# Patient Record
Sex: Female | Born: 1950 | Race: Black or African American | Hispanic: No | Marital: Married
Health system: Midwestern US, Community
[De-identification: ages and names within clinical notes are randomized; demographics above are authoritative.]

## PROBLEM LIST (undated history)

## (undated) DIAGNOSIS — I10 Essential (primary) hypertension: Secondary | ICD-10-CM

## (undated) DIAGNOSIS — Z8719 Personal history of other diseases of the digestive system: Secondary | ICD-10-CM

## (undated) DIAGNOSIS — E119 Type 2 diabetes mellitus without complications: Secondary | ICD-10-CM

## (undated) DIAGNOSIS — T7840XA Allergy, unspecified, initial encounter: Secondary | ICD-10-CM

## (undated) HISTORY — DX: Allergy, unspecified, initial encounter: T78.40XA

## (undated) HISTORY — DX: Essential (primary) hypertension: I10

## (undated) HISTORY — DX: Personal history of other diseases of the digestive system: Z87.19

## (undated) HISTORY — PX: BREAST BIOPSY: SHX20

---

## 1994-11-27 HISTORY — PX: ABDOMINAL HYSTERECTOMY: SHX81

## 2011-08-18 ENCOUNTER — Ambulatory Visit: Payer: Self-pay

## 2012-03-29 ENCOUNTER — Ambulatory Visit: Payer: Self-pay | Admitting: Urology

## 2012-07-21 ENCOUNTER — Emergency Department: Payer: Self-pay | Admitting: Emergency Medicine

## 2012-07-21 LAB — URINALYSIS, COMPLETE
Blood: NEGATIVE
Glucose,UR: NEGATIVE mg/dL (ref 0–75)
Nitrite: NEGATIVE
Ph: 5 (ref 4.5–8.0)
Protein: 30
Specific Gravity: 1.031 (ref 1.003–1.030)

## 2012-07-23 LAB — URINE CULTURE

## 2012-11-27 HISTORY — PX: HAND ARTHROPLASTY: SHX968

## 2013-04-16 ENCOUNTER — Encounter: Payer: Self-pay | Admitting: Family Medicine

## 2013-04-27 ENCOUNTER — Encounter: Payer: Self-pay | Admitting: Family Medicine

## 2013-05-26 ENCOUNTER — Emergency Department: Payer: Self-pay | Admitting: Emergency Medicine

## 2013-05-26 LAB — COMPREHENSIVE METABOLIC PANEL
Alkaline Phosphatase: 60 U/L (ref 50–136)
Anion Gap: 5 — ABNORMAL LOW (ref 7–16)
BUN: 13 mg/dL (ref 7–18)
Calcium, Total: 9.2 mg/dL (ref 8.5–10.1)
Co2: 31 mmol/L (ref 21–32)
Osmolality: 283 (ref 275–301)
Potassium: 3.2 mmol/L — ABNORMAL LOW (ref 3.5–5.1)
SGOT(AST): 44 U/L — ABNORMAL HIGH (ref 15–37)
Sodium: 141 mmol/L (ref 136–145)

## 2013-05-26 LAB — URINALYSIS, COMPLETE
Bilirubin,UR: NEGATIVE
Glucose,UR: NEGATIVE mg/dL (ref 0–75)
Ketone: NEGATIVE
Leukocyte Esterase: NEGATIVE
Protein: NEGATIVE
RBC,UR: 1 /HPF (ref 0–5)

## 2013-05-26 LAB — CBC
MCH: 29.4 pg (ref 26.0–34.0)
Platelet: 226 10*3/uL (ref 150–440)
WBC: 7.7 10*3/uL (ref 3.6–11.0)

## 2013-05-27 ENCOUNTER — Encounter: Payer: Self-pay | Admitting: Family Medicine

## 2013-08-05 ENCOUNTER — Emergency Department: Payer: Self-pay | Admitting: Emergency Medicine

## 2013-08-05 LAB — CK TOTAL AND CKMB (NOT AT ARMC)
CK, Total: 132 U/L (ref 21–215)
CK-MB: 1 ng/mL (ref 0.5–3.6)

## 2013-08-05 LAB — BASIC METABOLIC PANEL
Anion Gap: 3 — ABNORMAL LOW (ref 7–16)
Chloride: 104 mmol/L (ref 98–107)
Creatinine: 0.75 mg/dL (ref 0.60–1.30)
EGFR (African American): >60
EGFR (Non-African Amer.): >60
Potassium: 3.2 mmol/L — ABNORMAL LOW (ref 3.5–5.1)

## 2013-08-05 LAB — CBC
HCT: 36.6 % (ref 35.0–47.0)
HGB: 12.3 g/dL (ref 12.0–16.0)
MCHC: 33.6 g/dL (ref 32.0–36.0)
RBC: 4.15 10*6/uL (ref 3.80–5.20)
RDW: 15.5 % — ABNORMAL HIGH (ref 11.5–14.5)
WBC: 8.8 10*3/uL (ref 3.6–11.0)

## 2013-08-05 LAB — TROPONIN I: Troponin-I: <0.02 ng/mL

## 2013-09-10 ENCOUNTER — Ambulatory Visit: Payer: Self-pay | Admitting: Specialist

## 2014-05-11 ENCOUNTER — Ambulatory Visit: Payer: Self-pay | Admitting: Unknown Physician Specialty

## 2014-05-13 LAB — PATHOLOGY REPORT

## 2016-03-13 ENCOUNTER — Ambulatory Visit: Payer: PRIVATE HEALTH INSURANCE | Attending: Physician Assistant | Admitting: Physical Therapy

## 2016-03-13 DIAGNOSIS — M6281 Muscle weakness (generalized): Secondary | ICD-10-CM | POA: Insufficient documentation

## 2016-03-13 DIAGNOSIS — M5441 Lumbago with sciatica, right side: Secondary | ICD-10-CM | POA: Insufficient documentation

## 2016-03-13 DIAGNOSIS — R252 Cramp and spasm: Secondary | ICD-10-CM | POA: Insufficient documentation

## 2016-03-13 NOTE — Therapy (Signed)
[facility]ICINE [address] [place], [place], [address] Phone: [phone]   Fax:  [phone]  Physical Therapy Evaluation  Patient Details  Name: Kim RiggsSylvia Lowery MRN: [id number] Date of Birth: 30-May-1951 Referring Provider: Dayton Bailiffobert John Tumey, PA  Encounter Date: 03/13/2016      PT End of Session - 03/13/16 1939    Visit Number 1   Number of Visits 12   Date for PT Re-Evaluation 04/24/16   PT Start Time 1935   PT Stop Time 2035   PT Time Calculation (min) 60 min   Activity Tolerance Patient tolerated treatment well   Behavior During Therapy [facility] for tasks assessed/performed      No past medical history on file.  No past surgical history on file.  There were no vitals filed for this visit.       Subjective Assessment - 03/13/16 1940    Subjective Patient reports increased glute pain with radiating symptoms running along the lateral aspect of the leg into the top of the foot. Increased low back pain with bending, rising from sit, raising self up on right LE to sit in a high chair, lifting and performing daily tasks. Valium decreases pain. Alleviation with medication and rest. Sleeps on side (rightside).   Pertinent History Experienced same pain 6 years prior, [place] pain   Limitations Sitting;Lifting;Standing;Walking   Patient Stated Goals Decrease pain in the glute, sacroiliac joint and back.    Currently in Pain? Yes   Pain Score 6    Pain Location Buttocks   Pain Orientation Right   Pain Descriptors / Indicators Aching;Tender;Numbness;Tingling   Pain Type Acute pain;Chronic pain   Pain Radiating Towards Radiating along lateral aspect of the leg into the dorsum of the foot   Pain Onset More than a month ago   Pain Frequency Constant   Aggravating Factors  Lifting, bending, prolonged lumbar flexion, ariising from sitting   Pain Relieving Factors medication, rest             [facility] - 03/13/16 1951    Assessment   Medical Diagnosis Acute right-sided low back pain with right-sided sciatica (M54.41)   Referring Provider Dayton Bailiffobert John Tumey, PA   Onset Date/Surgical Date 12/29/15   Hand Dominance Right   Next MD Visit Unknown   Prior Therapy None   Precautions   Precautions None   Precaution Comments None   Balance Screen   Has the patient fallen in the past 6 months No   Has the patient had a decrease in activity level because of a fear of falling?  No   Is the patient reluctant to leave their home because of a fear of falling?  No   Home Environment   Living Environment Private residence   Living Arrangements Spouse/significant other   Type of Home House   Home Access Stairs to enter   Entrance Stairs-Number of Steps 3   Entrance Stairs-Rails Can reach both   Home Layout One level   Prior Function   Level of Independence Independent   Vocation Full time employment   Engineer, siteVocation Requirements Nurse Practioner: Stepping to sit on a high chair, walking,  lifting, and bending    Leisure Gardening, estate Airline pilotsales,    IT consultantCognition   Overall Cognitive Status Within Functional Limits for tasks assessed       Objective: Observation: Ambulation: Increased stance time on L Palpation: Increased tenderness,  pain and spasms along the right SIJ, glute max, lower lumbar spine paraspinal muscles and superior attachment to hamstring.   Measurement: Lumbar AROM/MMT: Flexion: WNL -- pain when arising from standing, Extension: 25% limited--pain at end range, Left lateral flexion: WNL, Right lateral flexion: WNL-- intense pain at end range, Right rotation: WNL, Left rotation: WNL R Hip AROM/MMT: Flexion:WNL/5/5, Extension: Not tested due to increased pain, ABD: WNL/ 4-/5, ADD:Not tested due to increased pain, ER:WNL/4-/5 L Hip AROM/MMT: Flexion:WNL/5/5, Extension:Not tested due to increased pain, ABD:WNL/5/5, ADD:Not tested due to increased pain,  ER:WNL/5/5,  R Knee AROM/MMT: Flexion:WNL/ 3+/5, Extension:WNL/5/5 L Knee AROM/MMT: Flexion: WNL/ 4-/5, Extension: WNL/5/5  Neurological Exam: Sensation (light touch): L1:WNL, L2:WNL, L3:WNL, L4:WNL, L5,S1,S2:hyper sensitive on R,; MMT: L1:WNL, L2:WNL, L3:WNL, L4:WNL, L5:WNL, S1:3+/5 on Right; 4-/5 on Left,  Reflexes: L4:Decreased bilaterally, S1:Decreased Bilaterally Special Testing: Hip: (+): SKOUR on R--positive for unrelated hip pain; FADIR on R: Increased glute pain. Hip (-): FABER bilaterally; SKOUR and FADIR on Left Lumbar (-): SLUMP--bilaterally; (-) SLR--bilaterally;  (-) cross legged SLR --bilaterally  Outcome Measures:  Modified Oswestry: 54% (Moderate-High self perceived disability: in need of intervention); FABQ-PA: 23/30 (in need of intervention) ; FABQ-W: 21/66  Treatment:  Therapeutic Exercise: Patient performed exercises with guidance, verbal and tactile cues and demonstration of therapist: Sidelying hip abduction-- x5 bilaterally Prone and prone on elbows -- ~ 5 min  Manual Therapy:  Soft tissue mobilization utilizing superficial techniques to the right glute max, lumbar extensors bilaterally, and superior attachment to hamstring to decrease spasms and guarding with the patient positioned in prone on elbows.   Modalities: Ice pack placed along lumbar spine (all levels) with patient in prone to decrease inflammation in the low back and spasms in the lumbar extensors x 8 min (no adverse reactions noted from ice)  Patient response to treatment: Resolution of pain after lying prone and place ice over lumbar spine for 8 min indicating decreased inflammation and extension bias. Increased R glute and radiating symptoms when performing general hip and back movements.        PT Education - 03/13/16 1939    Education provided Yes   Education Details HEP including posture/positioning for sleep, sitting with handout for daily activties, instructed to use ice and lie prone or  side lying to relieve symptoms   Person(s) Educated Patient   Methods Explanation;Handout;Demonstration   Comprehension Verbalized understanding;Returned demonstration             PT Long Term Goals - 03/13/16 1934    PT LONG TERM GOAL #1   Title Pt will demonstrate a score of 20% or less of the Modified Oswestry Scale outcome measure by 04/24/16 indicating significant improvement in lumbar function and ablility to better perform lifting activities.   Baseline Modified Oswestry Score: 54%   Status New   PT LONG TERM GOAL #2   Title Pt will be independent with HEP in terms of exercise progression and performance by 04/24/16 to self manage and maintain functional gains from therapy.    Baseline Dependent with exercise performance and progression   Status New               Plan - 03/13/16 1940    Clinical Impression Statement Patient is a 65 yo female presenting with right sided low back and buttock pain with N/T radiating down the lateral  aspect of the LE into the top of the foot. Patient's Modified Oswestry score indicates significant self perceived disability (54%) with daily tasks  and  presents with increased pain with all lumbar movements. Sacroiliac joint provacation tests were negative for sacroiliac joint dysfunction. Pain decreased after lying prone with ice (placed over the lumbar spine)  which indicates directional preference and increased sciatic nerve irritation secondary to inflammation. Pt will benefit from further skilled physical therapy intervention to improve lumbar function and decrease pain to return  to prior level of function.    Rehab Potential Good   Clinical Impairments Affecting Rehab Potential (+) Highly motivated, family support; (-) age, chronicity of condition   PT Frequency 2x / week   PT Duration 6 weeks   PT Treatment/Interventions Cryotherapy;Electrical Stimulation;Iontophoresis /ml Dexamethasone;Moist Heat;Therapeutic exercise;Therapeutic  activities;Stair training;Ultrasound;Neuromuscular re-education;Patient/family education;Manual techniques;Passive range of motion   PT Next Visit Plan Pelvic tilits, prone hip ext,    PT Home Exercise Plan Positioning and ergonomics    Consulted and Agree with Plan of Care Patient      Patient will benefit from skilled therapeutic intervention in order to improve the following deficits and impairments:  Decreased coordination, Increased fascial restricitons, Decreased endurance, Increased muscle spasms, Decreased activity tolerance, Pain, Decreased mobility, Decreased strength, Increased edema, Impaired sensation, Impaired flexibility, Decreased range of motion  Visit Diagnosis: Muscle weakness (generalized) - Plan: PT plan of care cert/re-cert  Cramp and spasm - Plan: PT plan of care cert/re-cert  Lumbago with sciatica, right side - Plan: PT plan of care cert/re-cert     Problem List There are no active problems to display for this patient.   Myrene Galas, SPT 03/14/2016, 5:38 PM  [facility]ICAL AND SPORTS MEDICINE [address] [place], [address] Phone: [phone]   Fax:  [phone]  Name: Kim Lowery MRN: [id number] Date of Birth: Nov 19, 1951

## 2016-03-21 ENCOUNTER — Ambulatory Visit: Payer: PRIVATE HEALTH INSURANCE | Admitting: Physical Therapy

## 2016-03-21 DIAGNOSIS — R252 Cramp and spasm: Secondary | ICD-10-CM

## 2016-03-21 DIAGNOSIS — M6281 Muscle weakness (generalized): Secondary | ICD-10-CM | POA: Diagnosis not present

## 2016-03-21 DIAGNOSIS — M5441 Lumbago with sciatica, right side: Secondary | ICD-10-CM

## 2016-03-21 NOTE — Therapy (Signed)
[facility] [address] [place], [place], [address] Phone: [phone]   Fax:  [phone]  Physical Therapy Treatment  Patient Details  Name: Kim RiggsSylvia Lowery MRN: [id number] Date of Birth: June 08, 1951 Referring Provider: Dayton Bailiffobert John Tumey, PA  Encounter Date: 03/21/2016      PT End of Session - 03/21/16 2014    Visit Number 2   Number of Visits 12   Date for PT Re-Evaluation 04/24/16   PT Start Time 1903   PT Stop Time 1945   PT Time Calculation (min) 42 min   Activity Tolerance Patient tolerated treatment well   Behavior During Therapy [facility] for tasks assessed/performed      No past medical history on file.  No past surgical history on file.  There were no vitals filed for this visit.      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 Continues with reports of right LE pain that has increased since previous session and she is unable to control pain even with medication.   Diagnostic tests MRI lumbar spine   Patient Stated Goals Decrease pain in the glute, sacroiliac joint and back.    Currently in Pain? Yes   Pain Score 6    Pain Location Buttocks  radiating into right LE   Pain Orientation Right   Pain Descriptors / Indicators Aching   Pain Type Acute pain   Pain Onset More than a month ago      Objective: Palpation: + spasms/pain/hyper sensitive to palpation right and left L5-S1 region  Gait: slow cadence Posture: in alignment, no obvious lateral shift right or left  Treatment:  patient performed exercises with guidance, verbal and tactile cues and demonstration of PT: Exercise:  Prone progression: prone lying x 10 min., attempted on elbows with reported increased symptoms therefore discontinued Flexion rotation with patient left side lying 3 x 30 seconds with good results with  decreased pain into right LE which stay reduced following session  Modalities: Ultrasound performed by PT to lower lumbar spine paraspinal region bilaterally: 1MHz pulsed 50% x 10 min 1.4w/cm2 with patient prone lying, head and LE's supported  Patient response to treatment:  Decreased pain in right LE to 0/10 and able to stand and walk at end of session without return of symptoms, patient verbalized good understanding of posture for sitting, avoid flexion activities, encourage extension as tolerated to decreased right LE symptoms         PT Education - 03/21/16 2014    Education provided Yes   Education Details HEP: flexion rotation, prone progression, standing extension, posture awareness for sitting, daily activties   Person(s) Educated Patient   Methods Explanation   Comprehension Verbalized understanding             PT Long Term Goals - 03/13/16 1934    PT LONG TERM GOAL #1   Title Pt will demonstrate a score of 20% or less of the Modified Oswestry Scale outcome measure by 04/24/16 indicating significant improvement in lumbar function and ablility to better perform lifting activities.   Baseline Modified Oswestry Score: 54% limited   Status New   PT LONG TERM GOAL #2   Title Pt will be independent with HEP in terms of exercise progression and performance by 04/24/16 to self manage and maintain functional gains from therapy.    Baseline  Dependent with exercise performance and progression   Status New               Plan - 03/21/16 2015    Clinical Impression Statement     Rehab Potential Patient responded well to therapy intervention with decreased pain with lumbar flexion rotation and ice to lower back and [place]. She should continue to progress with PT intervention including iontophoresis next session to further decreased inflammation and pain.  Good   PT Frequency 2x / week   PT Duration PT Treatment/Interventions 6 weeks Cryotherapy;Electrical  Stimulation;Iontophoresis /ml Dexamethasone;Moist Heat;Therapeutic exercise;Therapeutic activities;Stair training;Ultrasound;Neuromuscular re-education;Patient/family education;Manual techniques;Passive range of motion   PT Next Visit Plan prone progression, modalities to decrease pain   PT Home Exercise Plan Positioning and ergonomics       Patient will benefit from skilled therapeutic intervention in order to improve the following deficits and impairments:  Decreased coordination, Increased fascial restricitons, Decreased endurance, Increased muscle spasms, Decreased activity tolerance, Pain, Decreased mobility, Decreased strength, Increased edema, Impaired sensation, Impaired flexibility, Decreased range of motion  Visit Diagnosis: Muscle weakness (generalized)  Cramp and spasm  Lumbago with sciatica, right side     Problem List There are no active problems to display for this patient.   Carl Best 03/21/2016, 8:18 PM  [facility] [address] [place], [address] Phone: [phone]   Fax:  [phone]  Name: Kim Lowery MRN: [id number] Date of Birth: January 04, 1951

## 2016-03-23 ENCOUNTER — Encounter: Payer: Self-pay | Admitting: Physical Therapy

## 2016-03-23 ENCOUNTER — Ambulatory Visit: Payer: PRIVATE HEALTH INSURANCE | Admitting: Physical Therapy

## 2016-03-23 DIAGNOSIS — M6281 Muscle weakness (generalized): Secondary | ICD-10-CM

## 2016-03-23 DIAGNOSIS — M5441 Lumbago with sciatica, right side: Secondary | ICD-10-CM

## 2016-03-23 DIAGNOSIS — R252 Cramp and spasm: Secondary | ICD-10-CM

## 2016-03-23 NOTE — Therapy (Signed)
[facility] [address], [place], [address] Phone: [phone]   Fax:  [phone]  Physical Therapy Treatment  Patient Details  Name: Kim Lowery MRN: [id number] Date of Birth: December 31, 1950 Referring Provider: Dayton Bailiff, PA  Encounter Date: 03/23/2016      PT End of Session - 03/23/16 1932    Visit Number 3   Number of Visits 12   Date for PT Re-Evaluation 04/24/16   PT Start Time 1853   PT Stop Time 1938   PT Time Calculation (min) 45 min   Activity Tolerance Patient tolerated treatment well   Behavior During Therapy [facility] for tasks assessed/performed      History reviewed. No pertinent past medical history.  History reviewed. No pertinent past surgical history.  There were no vitals filed for this visit.      Subjective Assessment - 03/23/16 1857    Subjective Patient reports she had a steroid injection in right buttock yesterday and reports she is having much less pain today. pain was a 12/10 and now is a 4 /10. She reports that the modalities help with her pain and she is performing exercises as instructed with good results.    Limitations Sitting;Lifting;Standing;Walking   Diagnostic tests MRI lumbar spine   Patient Stated Goals Decrease pain in the glute, sacroiliac joint and back.    Currently in Pain? Yes   Pain Score 6    Pain Location Buttocks   Pain Orientation Right   Pain Descriptors / Indicators Aching   Pain Type Acute pain   Pain Onset More than a month ago   Pain Frequency Constant        Objective: Palpation: + spasms/pain/hyper sensitive to palpation right and left L5-S1 region  Posture: in alignment, no obvious lateral shift right or left  Treatment:  patient performed exercises with guidance, verbal and tactile cues and demonstration of PT: Exercise:  Prone progression: prone lying x 10 min., on elbows x 2 mn.  with reported symptoms increasing into right  lateral thigh therefore discontinued Flexion rotation with patient left and right side lying 2 x 30 seconds with good results with decreased pain into right LE which stay reduced following session  Modalities: Ultrasound performed by PT to lower lumbar spine paraspinal region bilaterally: pulsed 50% x 10 min 1.4w/cm2 with patient prone lying, head and LE's supported High volt estim: clinical program for muscle spasms: (4) electrodes applied to lumbar spine paraspinal muscles with ice pack with patient prone lying with pillow supporting lower legs x 15 min.  Patient response to treatment:  Decreased pain in right LE to 0/10 and able to stand and walk at end of session without return of symptoms, patient verbalized good understanding of posture for sitting, avoid flexion activities, encourage extension as tolerated to decreased right LE symptoms, no adverse reactions noted to ice/stim or [place]          PT Long Term Goals - 03/13/16 1934    PT LONG TERM GOAL #1   Title Pt will demonstrate a score of 20% or less of the Modified Oswestry Scale outcome measure by 04/24/16 indicating significant improvement in lumbar function and ablility to better perform lifting activities.   Baseline Modified Oswestry Score: 54% limited   Status New   PT LONG TERM GOAL #2   Title Pt will be independent with HEP in terms of exercise progression and performance by 04/24/16 to self manage and maintain  functional gains from therapy.    Baseline Dependent with exercise performance and progression   Status New               Plan - 03/23/16 1932    Clinical Impression Statement Patient is responding well to modalities to decrease pain and spasms. Improved abiltiy to walk and sit with less pain at end of the day. Good understanding of posture awareness and exercises to reduce symptoms.    Rehab Potential Good   PT Frequency 2x / week   PT Duration 6 weeks   PT Treatment/Interventions  Cryotherapy;Electrical Stimulation;Iontophoresis 4mg /ml Dexamethasone;Moist Heat;Therapeutic exercise;Therapeutic activities;Stair training;Ultrasound;Neuromuscular re-education;Patient/family education;Manual techniques;Passive range of motion   PT Next Visit Plan prone progression; posture awareness for sitting/standing and daily chores   PT Home Exercise Plan Positioning and ergonomics       Patient will benefit from skilled therapeutic intervention in order to improve the following deficits and impairments:  Decreased coordination, Increased fascial restricitons, Decreased endurance, Increased muscle spasms, Decreased activity tolerance, Pain, Decreased mobility, Decreased strength, Increased edema, Impaired sensation, Impaired flexibility, Decreased range of motion  Visit Diagnosis: Muscle weakness (generalized)  Cramp and spasm  Lumbago with sciatica, right side     Problem List There are no active problems to display for this patient.   Beacher MayBrooks, Marie PT 03/23/2016, 10:30 PM   [facility] [address] [place], [place], [address] Phone: [phone]   Fax:  [phone]  Name: Kim RiggsSylvia Lowery MRN: [id number] Date of Birth: July 08, 1951

## 2016-03-28 ENCOUNTER — Encounter: Payer: Self-pay | Admitting: Physical Therapy

## 2016-03-28 ENCOUNTER — Encounter: Payer: PRIVATE HEALTH INSURANCE | Admitting: Physical Therapy

## 2016-03-28 ENCOUNTER — Ambulatory Visit: Payer: PRIVATE HEALTH INSURANCE | Attending: Physician Assistant | Admitting: Physical Therapy

## 2016-03-28 DIAGNOSIS — R252 Cramp and spasm: Secondary | ICD-10-CM | POA: Diagnosis present

## 2016-03-28 DIAGNOSIS — M6281 Muscle weakness (generalized): Secondary | ICD-10-CM | POA: Diagnosis present

## 2016-03-28 DIAGNOSIS — M5441 Lumbago with sciatica, right side: Secondary | ICD-10-CM

## 2016-03-29 NOTE — Therapy (Signed)
[place] [facility] [address], [place], [address] Phone: [phone]   Fax:  [phone]  Physical Therapy Treatment  Patient Details  Name: Kim Lowery MRN: [id number] Date of Birth: 25-Oct-1951 Referring Provider: Dayton Bailiff, PA  Encounter Date: 03/28/2016      PT End of Session - 03/28/16 1100    Visit Number 4   Number of Visits 12   Date for PT Re-Evaluation 04/24/16   PT Start Time 1002   PT Stop Time 1040   PT Time Calculation (min) 38 min   Activity Tolerance Patient tolerated treatment well   Behavior During Therapy [facility] for tasks assessed/performed      History reviewed. No pertinent past medical history.  History reviewed. No pertinent past surgical history.  There were no vitals filed for this visit.      Subjective Assessment - 03/28/16 1007    Subjective Patient reports she is having much less pain today. pain was moderate and is now mild. She reports that the modalities help with her pain and she is performing exerises as instructed with good results.    Limitations Sitting;Lifting;Standing;Walking   Diagnostic tests MRI lumbar spine   Patient Stated Goals Decrease pain in the glute, sacroiliac joint and back.    Currently in Pain? Yes   Pain Score 2    Pain Location Leg  right outer thigh   Pain Orientation Right   Pain Descriptors / Indicators Aching   Pain Type Acute pain   Pain Onset More than a month ago      Objective: Palpation: + spasms/pain/hyper sensitive to palpation right and left L5-S1 region  Posture: in alignment, no obvious lateral shift right or left  Treatment:  patient performed exercises with guidance, verbal and tactile cues and demonstration of PT: Exercise:  Prone progression: prone lying x 10 min., on elbows x 2 mn. with reported symptoms increasing into right lateral thigh therefore discontinued Prone upper back extension x 3, hip extension  left and right x 3 reps: both with increased symptoms in back and right lateral thigh  Modalities: Ultrasound performed by PT to lower lumbar spine paraspinal region bilaterally: pulsed 50% x 10 min 1.4w/cm2 with patient prone lying, head and LE's supported High volt estim: clinical program for muscle spasms: (4) electrodes applied to lumbar spine paraspinal muscles with ice pack with patient prone lying with pillow supporting lower legs x 15 min.  Patient response to treatment:  Decreased pain in right LE to 0/10 and able to stand and walk at end of session without return of symptoms, patient verbalized good understanding of posture for sitting, avoid flexion activities, encourage extension as tolerated to decrease right LE symptoms, no adverse reactions noted to ice/stim or [place]        PT Education - 03/28/16 1000    Education provided Yes   Education Details HEP: continue with pain control and posiitoning, posture awareness   Person(s) Educated Patient   Methods Explanation   Comprehension Verbalized understanding             PT Long Term Goals - 03/13/16 1934    PT LONG TERM GOAL #1   Title Pt will demonstrate a score of 20% or less of the Modified Oswestry Scale outcome measure by 04/24/16 indicating significant improvement in lumbar function and ablility to better perform lifting activities.   Baseline Modified Oswestry Score: 54% limited   Status New   PT  LONG TERM GOAL #2   Title Pt will be independent with HEP in terms of exercise progression and performance by 04/24/16 to self manage and maintain functional gains from therapy.    Baseline Dependent with exercise performance and progression   Status New               Plan - 03/28/16 1100    Clinical Impression Statement Patient demonstrates decreasing pain in right lower back/right LE with treatment. She continues with increased symptoms with aggravating activities such as active extension of LE and upper  back. She was unable to progress to more active exercises due to increased symptoms in right LE. She wil continue to require physical therapy intervention to control swelling/inflammation and pain in order to regain full pain free ROM and return to prior level of funciton.    Rehab Potential Good   PT Frequency 2x / week   PT Duration 6 weeks   PT Treatment/Interventions Cryotherapy;Electrical Stimulation;Iontophoresis 4mg /ml Dexamethasone;Moist Heat;Therapeutic exercise;Therapeutic activities;Stair training;Ultrasound;Neuromuscular re-education;Patient/family education;Manual techniques;Passive range of motion   PT Next Visit Plan prone progression; pain control   PT Home Exercise Plan Positioning and ergonomics       Patient will benefit from skilled therapeutic intervention in order to improve the following deficits and impairments:  Decreased coordination, Increased fascial restricitons, Decreased endurance, Increased muscle spasms, Decreased activity tolerance, Pain, Decreased mobility, Decreased strength, Increased edema, Impaired sensation, Impaired flexibility, Decreased range of motion  Visit Diagnosis: Muscle weakness (generalized)  Cramp and spasm  Lumbago with sciatica, right side     Problem List There are no active problems to display for this patient.   Beacher MayBrooks, Marie PT 03/29/2016, 8:41 AM  [facility] [address] [place], [place], [address] Phone: [phone]   Fax:  [phone]  Name: Kim RiggsSylvia Lowery MRN: [id number] Date of Birth: 1951/08/20

## 2016-03-30 ENCOUNTER — Encounter: Payer: Self-pay | Admitting: Physical Therapy

## 2016-03-30 ENCOUNTER — Ambulatory Visit: Payer: PRIVATE HEALTH INSURANCE | Admitting: Physical Therapy

## 2016-03-30 DIAGNOSIS — M5441 Lumbago with sciatica, right side: Secondary | ICD-10-CM

## 2016-03-30 DIAGNOSIS — M6281 Muscle weakness (generalized): Secondary | ICD-10-CM

## 2016-03-30 DIAGNOSIS — R252 Cramp and spasm: Secondary | ICD-10-CM

## 2016-03-30 NOTE — Therapy (Signed)
[place] [facility] [address], [place], [address] Phone: [phone]   Fax:  [phone]  Physical Therapy Treatment  Patient Details  Name: Kim Lowery MRN: [id number] Date of Birth: 03/15/1951 Referring Provider: Dayton Bailiff, PA  Encounter Date: 03/30/2016      PT End of Session - 03/30/16 2000    Visit Number 5   Number of Visits 12   Date for PT Re-Evaluation 04/24/16   PT Start Time 1907   PT Stop Time 1955   PT Time Calculation (min) 48 min   Activity Tolerance Patient tolerated treatment well   Behavior During Therapy [facility] for tasks assessed/performed      History reviewed. No pertinent past medical history.  History reviewed. No pertinent past surgical history.  There were no vitals filed for this visit.      Subjective Assessment - 03/30/16 1937    Subjective Patient reports much improved and able to move with less difficulty wiht daily tasks at work and home. patient eager to return to yoga. She is not currently having any radicular symptoms into right LE. She is tender in right lower back L5-S1 region.    Limitations Sitting;Lifting;Standing;Walking   Patient Stated Goals Decrease pain in the glute, sacroiliac joint and back.    Currently in Pain? Yes   Pain Score 1    Pain Location Leg   Pain Orientation Right   Pain Descriptors / Indicators Aching   Pain Type Acute pain   Pain Onset More than a month ago   Pain Frequency Intermittent      Objective: Palpation: + spasms/pain/hyper sensitive to palpation right and left L5-S1 region  Posture: in alignment, no obvious lateral shift right or left  Treatment:  patient performed exercises with guidance, verbal and tactile cues and demonstration of PT: Exercise:  Prone progression: prone lying x 10 min. With review verbally and with treat your own back book for extension progression, good posture, sitting, lying positions and  standing extension    Modalities: Ultrasound performed by PT to lower lumbar spine paraspinal region bilaterally: pulsed 50% x 10 min 1.4w/cm2 with patient prone lying, head and LE's supported High volt estim: clinical program for muscle spasms: (4) electrodes applied to lumbar spine paraspinal muscles with patient prone lying with pillow supporting lower legs x 15 min.  Patient response to treatment:  Decreased pain in right LE to 0/10 and able to stand and walk at end of session without return of symptoms, patient verbalized good understanding of exercise/posture awareness. encouraged extension as tolerated to decrease right LE symptoms, no adverse reactions noted with stim or [place]        PT Education - 03/30/16 2005    Education provided Yes   Education Details HEP: low back pain extension principle exercises, conitnue posture awareness   Person(s) Educated Patient   Methods Explanation;Verbal cues; treat your own back book   Comprehension Verbalized understanding             PT Long Term Goals - 03/13/16 1934    PT LONG TERM GOAL #1   Title Pt will demonstrate a score of 20% or less of the Modified Oswestry Scale outcome measure by 04/24/16 indicating significant improvement in lumbar function and ablility to better perform lifting activities.   Baseline Modified Oswestry Score: 54% limited   Status New   PT LONG TERM GOAL #2   Title Pt will be independent with HEP  in terms of exercise progression and performance by 04/24/16 to self manage and maintain functional gains from therapy.    Baseline Dependent with exercise performance and progression   Status New               Plan - 03/30/16 2000    Clinical Impression Statement Patient with decreased right LE symptoms with much less frequency and intensity. She reports good results from US and high volt electreical stimulation. She should be able to progress with exercises for core cotnrol and ROM with continued  physical therpay intervention.    Rehab Potential Good   PT Frequency 2x / week   PT Duration 6 weeks   PT Treatment/Interventions Cryotherapy;Electrical Stimulation;Iontophoresis 4mg /ml Dexamethasone;Moist Heat;Therapeutic exercise;Therapeutic activities;Stair training;Ultrasound;Neuromuscular re-education;Patient/family education;Manual techniques;Passive range of motion   PT Next Visit Plan prone progression; pain control   PT Home Exercise Plan Positioning and ergonomics       Patient will benefit from skilled therapeutic intervention in order to improve the following deficits and impairments:  Decreased coordination, Increased fascial restricitons, Decreased endurance, Increased muscle spasms, Decreased activity tolerance, Pain, Decreased mobility, Decreased strength, Increased edema, Impaired sensation, Impaired flexibility, Decreased range of motion  Visit Diagnosis: Muscle weakness (generalized)  Cramp and spasm  Lumbago with sciatica, right side     Problem List There are no active problems to display for this patient.   Beacher MayBrooks, Oryon Gary PT 03/30/2016, 11:29 PM  [place] [facility] [address] [place], [place], [address] Phone: [phone]   Fax:  [phone]  Name: Kim RiggsSylvia Lowery MRN: [id number] Date of Birth: 07-05-1951

## 2016-04-04 ENCOUNTER — Ambulatory Visit: Payer: PRIVATE HEALTH INSURANCE | Admitting: Physical Therapy

## 2016-04-04 ENCOUNTER — Encounter: Payer: Self-pay | Admitting: Physical Therapy

## 2016-04-04 DIAGNOSIS — M5441 Lumbago with sciatica, right side: Secondary | ICD-10-CM

## 2016-04-04 DIAGNOSIS — M6281 Muscle weakness (generalized): Secondary | ICD-10-CM | POA: Diagnosis not present

## 2016-04-04 DIAGNOSIS — R252 Cramp and spasm: Secondary | ICD-10-CM

## 2016-04-04 NOTE — Therapy (Signed)
[facility] [address] [place], [place], [address] Phone: [phone]   Fax:  [phone]  Physical Therapy Treatment  Patient Details  Name: Kim RiggsSylvia Roan MRN: [id number] Date of Birth: [birth year] Referring Provider: Dayton Bailiffobert John Tumey, PA  Encounter Date: 04/04/2016      PT End of Session - 04/04/16 2000    Visit Number 6   Number of Visits 12   Date for PT Re-Evaluation 04/24/16   PT Start Time 1900   PT Stop Time 1955   PT Time Calculation (min) 55 min   Activity Tolerance Patient tolerated treatment well   Behavior During Therapy [facility] for tasks assessed/performed      History reviewed. No pertinent past medical history.  History reviewed. No pertinent past surgical history.  There were no vitals filed for this visit.      Subjective Assessment - 04/04/16 1942    Subjective Patient reports she is doing much better and is responding well to current POC.    Limitations Sitting;Lifting;Standing;Walking   Patient Stated Goals Decrease pain in the glute, sacroiliac joint and back.       Objective: Palpation: + spasms/pain/hyper sensitive to palpation right and left L5-S1 region  Posture: in alignment, no obvious lateral shift right or left  Treatment:  patient performed exercises with guidance, verbal and tactile cues and demonstration of PT: Exercise:  Prone progression: prone lying x 10 min. With review verbally and with treat your own back book for extension progression, good posture, sitting, lying positions and standing extension  Added sitting stabilization: seated  stabilization with hip adduction with ball and glute sets x 10, hip abduction with resistive band x 15, standing stabilizaiton with resistive band shoulder rows and extension to hips x 10 reps each.   Modalities: Ultrasound performed by PT to lower lumbar spine paraspinal region bilaterally: 1MHz pulsed 50% x 10 min 1.4w/cm2 with  patient prone lying, head and LE's supported High volt estim: clinical program for muscle spasms: (4) electrodes applied to lumbar spine paraspinal muscles with patient prone lying with pillow supporting lower legs x 20 min.  Patient response to treatment:  Decreased pain in right LE to 0/10, did have some "tired" feeling at end of session and able to stand and walk  without return of symptoms, patient verbalized good understanding of exercise/posture awareness. encouraged to continue extension and posture awareness  to decrease/prevent return of right LE symptoms, no adverse reactions noted with stim or US            PT Education - 04/04/16 2000    Education provided Yes   Education Details HEP: seated  stabilizaoitn with hip adduction with ball and glute sets, hip abduction with resistive band, standing stabilizaiton with resistive band shoulder rows and extension to hips.    Person(s) Educated Patient   Methods Explanation;Demonstration;Verbal cues;Handout   Comprehension Verbalized understanding;Returned demonstration;Verbal cues required             PT Long Term Goals - 03/13/16 1934    PT LONG TERM GOAL #1   Title Pt will demonstrate a score of 20% or less of the Modified Oswestry Scale outcome measure by 04/24/16 indicating significant improvement in lumbar function and ablility to better perform lifting activities.   Baseline Modified Oswestry Score: 54% limited   Status New   PT LONG TERM GOAL #2   Title Pt will be independent with HEP in terms of exercise progression and performance by  04/24/16 to self manage and maintain functional gains from therapy.    Baseline Dependent with exercise performance and progression   Status New               Plan - 04/04/16 2010    Clinical Impression Statement Patient is progressing well with goals with decreasing pain in right LE and able to tolerate exercises for stabilization   PT Frequency 2x / week   PT Duration 6 weeks    PT Treatment/Interventions Cryotherapy;Electrical Stimulation;Iontophoresis /ml Dexamethasone;Moist Heat;Therapeutic exercise;Therapeutic activities;Stair training;Ultrasound;Neuromuscular re-education;Patient/family education;Manual techniques;Passive range of motion   PT Next Visit Plan prone progression; progressive stabilizaiton exercises; pain control   PT Home Exercise Plan stabilizaiton, prone progression      Patient will benefit from skilled therapeutic intervention in order to improve the following deficits and impairments:  Decreased coordination, Increased fascial restricitons, Decreased endurance, Increased muscle spasms, Decreased activity tolerance, Pain, Decreased mobility, Decreased strength, Increased edema, Impaired sensation, Impaired flexibility, Decreased range of motion  Visit Diagnosis: Muscle weakness (generalized)  Cramp and spasm  Lumbago with sciatica, right side     Problem List There are no active problems to display for this patient.   Beacher May PT 04/04/2016, 10:59 PM  [place] [facility] [address], [place], [address] Phone: [phone]   Fax:  [phone]  Name: Deshana Rominger MRN: [id number] Date of Birth: Jul 30, 1951

## 2016-04-06 ENCOUNTER — Ambulatory Visit: Payer: PRIVATE HEALTH INSURANCE | Admitting: Physical Therapy

## 2016-04-11 ENCOUNTER — Encounter: Payer: PRIVATE HEALTH INSURANCE | Admitting: Physical Therapy

## 2016-04-13 ENCOUNTER — Encounter: Payer: PRIVATE HEALTH INSURANCE | Admitting: Physical Therapy

## 2016-04-18 ENCOUNTER — Ambulatory Visit: Payer: PRIVATE HEALTH INSURANCE | Admitting: Physical Therapy

## 2016-04-20 ENCOUNTER — Ambulatory Visit: Payer: PRIVATE HEALTH INSURANCE | Admitting: Physical Therapy

## 2016-04-25 ENCOUNTER — Ambulatory Visit: Payer: PRIVATE HEALTH INSURANCE | Admitting: Physical Therapy

## 2016-04-27 ENCOUNTER — Encounter: Payer: PRIVATE HEALTH INSURANCE | Admitting: Physical Therapy

## 2016-05-02 ENCOUNTER — Encounter: Payer: PRIVATE HEALTH INSURANCE | Admitting: Physical Therapy

## 2016-05-04 ENCOUNTER — Encounter: Payer: PRIVATE HEALTH INSURANCE | Admitting: Physical Therapy

## 2016-11-27 HISTORY — PX: COLONOSCOPY: SHX174

## 2017-08-13 ENCOUNTER — Encounter: Payer: Self-pay | Admitting: Emergency Medicine

## 2017-08-13 DIAGNOSIS — R1012 Left upper quadrant pain: Secondary | ICD-10-CM | POA: Diagnosis present

## 2017-08-13 DIAGNOSIS — K859 Acute pancreatitis without necrosis or infection, unspecified: Secondary | ICD-10-CM | POA: Diagnosis not present

## 2017-08-13 DIAGNOSIS — K2900 Acute gastritis without bleeding: Secondary | ICD-10-CM | POA: Diagnosis not present

## 2017-08-13 DIAGNOSIS — Z79899 Other long term (current) drug therapy: Secondary | ICD-10-CM | POA: Insufficient documentation

## 2017-08-13 DIAGNOSIS — E119 Type 2 diabetes mellitus without complications: Secondary | ICD-10-CM | POA: Insufficient documentation

## 2017-08-13 LAB — CBC
HCT: 37.4 % (ref 35.0–47.0)
Hemoglobin: 12.7 g/dL (ref 12.0–16.0)
MCH: 29.5 pg (ref 26.0–34.0)
MCHC: 33.8 g/dL (ref 32.0–36.0)
MCV: 87.2 fL (ref 80.0–100.0)
Platelets: 267 10*3/uL (ref 150–440)
RBC: 4.29 MIL/uL (ref 3.80–5.20)
RDW: 15.3 % — ABNORMAL HIGH (ref 11.5–14.5)
WBC: 7.6 10*3/uL (ref 3.6–11.0)

## 2017-08-13 LAB — URINALYSIS, COMPLETE (UACMP) WITH MICROSCOPIC
Bacteria, UA: NONE SEEN
Bilirubin Urine: NEGATIVE
GLUCOSE, UA: NEGATIVE mg/dL
KETONES UR: NEGATIVE mg/dL
Leukocytes, UA: NEGATIVE
Nitrite: NEGATIVE
PH: 5 (ref 5.0–8.0)
PROTEIN: NEGATIVE mg/dL
SQUAMOUS EPITHELIAL / LPF: NONE SEEN
Specific Gravity, Urine: 1.017 (ref 1.005–1.030)

## 2017-08-13 LAB — TROPONIN I: Troponin I: <0.03 ng/mL (ref <0.03)

## 2017-08-13 LAB — COMPREHENSIVE METABOLIC PANEL
ALBUMIN: 4.2 g/dL (ref 3.5–5.0)
ALK PHOS: 50 U/L (ref 38–126)
ALT: 33 U/L (ref 14–54)
AST: 25 U/L (ref 15–41)
Anion gap: 8 (ref 5–15)
BUN: 15 mg/dL (ref 6–20)
CALCIUM: 9.9 mg/dL (ref 8.9–10.3)
CHLORIDE: 101 mmol/L (ref 101–111)
CO2: 30 mmol/L (ref 22–32)
CREATININE: 0.83 mg/dL (ref 0.44–1.00)
GFR calc Af Amer: >60 mL/min (ref >60)
GFR calc non Af Amer: >60 mL/min (ref >60)
GLUCOSE: 107 mg/dL — AB (ref 65–99)
Potassium: 3.3 mmol/L — ABNORMAL LOW (ref 3.5–5.1)
SODIUM: 139 mmol/L (ref 135–145)
Total Bilirubin: 0.3 mg/dL (ref 0.3–1.2)
Total Protein: 7.5 g/dL (ref 6.5–8.1)

## 2017-08-13 LAB — LIPASE, BLOOD: LIPASE: 118 U/L — AB (ref 11–51)

## 2017-08-13 MED ORDER — ONDANSETRON 4 MG PO TBDP
4.0000 mg | ORAL_TABLET | Freq: Once | ORAL | Status: AC | PRN
  Administered 2017-08-13: 4 mg via ORAL
  Filled 2017-08-13: qty 1

## 2017-08-13 NOTE — ED Triage Notes (Signed)
Patient to ER for c/o LUQ abd pain since Friday. States she is vegan and thought it was indigestion d/t eating piece of cake. +Nausea. Had fleet's enema this am with no relief of pain. States she is unable to belch at this time. Denies vomiting.

## 2017-08-14 ENCOUNTER — Other Ambulatory Visit: Payer: Self-pay | Admitting: Gastroenterology

## 2017-08-14 ENCOUNTER — Emergency Department: Payer: 59

## 2017-08-14 ENCOUNTER — Ambulatory Visit
Admission: RE | Admit: 2017-08-14 | Discharge: 2017-08-14 | Disposition: A | Discharge status: Home / Self Care | Payer: 59 | Source: Ambulatory Visit | Attending: Gastroenterology | Admitting: Gastroenterology

## 2017-08-14 ENCOUNTER — Emergency Department
Admission: EM | Admit: 2017-08-14 | Discharge: 2017-08-14 | Disposition: A | Discharge status: Home / Self Care | Payer: 59 | Attending: Emergency Medicine | Admitting: Emergency Medicine

## 2017-08-14 DIAGNOSIS — R109 Unspecified abdominal pain: Secondary | ICD-10-CM

## 2017-08-14 DIAGNOSIS — R1011 Right upper quadrant pain: Secondary | ICD-10-CM

## 2017-08-14 DIAGNOSIS — K859 Acute pancreatitis without necrosis or infection, unspecified: Secondary | ICD-10-CM

## 2017-08-14 DIAGNOSIS — K2900 Acute gastritis without bleeding: Secondary | ICD-10-CM

## 2017-08-14 DIAGNOSIS — R112 Nausea with vomiting, unspecified: Secondary | ICD-10-CM

## 2017-08-14 HISTORY — DX: Type 2 diabetes mellitus without complications: E11.9

## 2017-08-14 MED ORDER — SUCRALFATE 1 G PO TABS: 1.0000 g | ORAL_TABLET | Freq: Two times a day (BID) | ORAL | 0 refills | Status: DC

## 2017-08-14 MED ORDER — IOPAMIDOL (ISOVUE-300) INJECTION 61%
100.0000 mL | Freq: Once | INTRAVENOUS | Status: AC | PRN
  Administered 2017-08-14: 100 mL via INTRAVENOUS

## 2017-08-14 MED ORDER — ONDANSETRON HCL 4 MG/2ML IJ SOLN
4.0000 mg | Freq: Once | INTRAMUSCULAR | Status: AC
  Administered 2017-08-14: 4 mg via INTRAVENOUS
  Filled 2017-08-14: qty 2

## 2017-08-14 MED ORDER — MORPHINE SULFATE (PF) 4 MG/ML IV SOLN
4.0000 mg | Freq: Once | INTRAVENOUS | Status: AC
  Administered 2017-08-14: 4 mg via INTRAVENOUS
  Filled 2017-08-14: qty 1

## 2017-08-14 MED ORDER — SUCRALFATE 1 G PO TABS
1.0000 g | ORAL_TABLET | Freq: Once | ORAL | Status: AC
  Administered 2017-08-14: 1 g via ORAL
  Filled 2017-08-14: qty 1

## 2017-08-14 MED ORDER — OXYCODONE-ACETAMINOPHEN 5-325 MG PO TABS: 1.0000 | ORAL_TABLET | Freq: Four times a day (QID) | ORAL | 0 refills | Status: DC | PRN

## 2017-08-14 MED ORDER — GI COCKTAIL ~~LOC~~
30.0000 mL | Freq: Once | ORAL | Status: AC
  Administered 2017-08-14: 30 mL via ORAL
  Filled 2017-08-14: qty 30

## 2017-08-14 MED ORDER — SODIUM CHLORIDE 0.9 % IV BOLUS (SEPSIS)
1000.0000 mL | Freq: Once | INTRAVENOUS | Status: AC
  Administered 2017-08-14: 1000 mL via INTRAVENOUS

## 2017-08-14 MED ORDER — ONDANSETRON 4 MG PO TBDP: 4.0000 mg | ORAL_TABLET | Freq: Three times a day (TID) | ORAL | 0 refills | Status: DC | PRN

## 2017-08-14 MED ORDER — ONDANSETRON 4 MG PO TBDP
ORAL_TABLET | ORAL | Status: AC
  Filled 2017-08-14: qty 1

## 2017-08-14 MED ORDER — ONDANSETRON 4 MG PO TBDP
4.0000 mg | ORAL_TABLET | Freq: Once | ORAL | Status: AC
  Administered 2017-08-14: 4 mg via ORAL

## 2017-08-14 MED ORDER — OXYCODONE-ACETAMINOPHEN 5-325 MG PO TABS
2.0000 | ORAL_TABLET | Freq: Once | ORAL | Status: AC
  Administered 2017-08-14: 2 via ORAL
  Filled 2017-08-14: qty 2

## 2017-08-14 NOTE — ED Provider Notes (Signed)
[facility] Emergency Department Provider Note   ____________________________________________   First MD Initiated Contact with Patient 08/14/17 0023     (approximate)  I have reviewed the triage vital signs and the nursing notes.   HISTORY  Chief Complaint Abdominal Pain    HPI Kim Lowery is a 66 y.o. female who comes into the hospital today with some left upper quadrant pain. She reports it has been unrelenting since Friday. She feels like the food she eats is not being digested. The patient ate some cake on Friday and thinks that it didn't agree with her. The patient reports that she's had some abdominal pain and bloating. She's been unable to eat much since then. Today she's been in pain all day and she hasn't been able to eat. She tried to eat some [employer] pizza and the pain became excruciating. She states that at home she was unable to get comfortable. She feels that there is a ball in her epigastric area. She's never had these symptoms before. The patient has had nausea with no vomiting. She thought maybe she was constipated and gave herself an enema this morning but reports that it didn't help. The patient rates her pain 8 out of 10 in intensity. She denies any chest pains or shortness of breath. She is here for evaluation.   Past Medical History:  Diagnosis Date  . Diabetes mellitus without complication (HCC)     There are no active problems to display for this patient.   Past Surgical History:  Procedure Laterality Date  . ABDOMINAL HYSTERECTOMY    . BREAST BIOPSY    . CESAREAN SECTION    . HAND ARTHROPLASTY Bilateral     Prior to Admission medications   Medication Sig Start Date End Date Taking? Authorizing Provider  diazepam (VALIUM) 5 MG tablet Take 5 mg by mouth every 6 (six) hours as needed for anxiety.    [provider]  ondansetron (ZOFRAN ODT) 4 MG disintegrating tablet Take 1 tablet (4 mg total) by mouth every 8 (eight)  hours as needed for nausea or vomiting. 08/14/17   Rebecka Apley, MD  oxyCODONE-acetaminophen (ROXICET) 5-325 MG tablet Take 1 tablet by mouth every 6 (six) hours as needed. 08/14/17   Rebecka Apley, MD  sucralfate (CARAFATE) 1 g tablet Take 1 tablet (1 g total) by mouth 2 (two) times daily. 08/14/17   Rebecka Apley, MD    Allergies Avelox [moxifloxacin hcl in nacl]; Macrobid [nitrofurantoin macrocrystal]; and Sulfa antibiotics  No family history on file.  Social History Social History  Substance Use Topics  . Smoking status: Never Smoker  . Smokeless tobacco: Never Used  . Alcohol use No    Review of Systems  Constitutional: No fever/chills Eyes: No visual changes. ENT: No sore throat. Cardiovascular: Denies chest pain. Respiratory: Denies shortness of breath. Gastrointestinal:  abdominal pain.   nausea, no vomiting.  No diarrhea.  No constipation. Genitourinary: Negative for dysuria. Musculoskeletal: Negative for back pain. Skin: Negative for rash. Neurological: Negative for headaches, focal weakness or numbness.   ____________________________________________   PHYSICAL EXAM:  VITAL SIGNS: ED Triage Vitals  Enc Vitals Group     BP 08/13/17 2117 (!) 155/80     Pulse Rate 08/13/17 2117 67     Resp 08/13/17 2117 20     Temp 08/13/17 2117 98.8 F (37.1 C)     Temp Source 08/13/17 2117 Oral     SpO2 08/13/17 2117 100 %  Weight 08/13/17 2117 151 lb (68.5 kg)     Height 08/13/17 2117  (1.6 m)     Head Circumference --      Peak Flow --      Pain Score 08/13/17 2115 8     Pain Loc --      Pain Edu? --      Excl. in GC? --     Constitutional: Alert and oriented. Well appearing and in moderate distress. Eyes: Conjunctivae are normal. PERRL. EOMI. Head: Atraumatic. Nose: No congestion/rhinnorhea. Mouth/Throat: Mucous membranes are moist.  Oropharynx non-erythematous. Cardiovascular: Normal rate, regular rhythm. Grossly normal heart sounds.   Good peripheral circulation. Respiratory: Normal respiratory effort.  No retractions. Lungs CTAB. Gastrointestinal: Soft with some epigastric tenderness to palpation.  Musculoskeletal: No lower extremity tenderness nor edema.   Neurologic:  Normal speech and language.  Skin:  Skin is warm, dry and intact.  Psychiatric: Mood and affect are normal.   ____________________________________________   LABS (all labs ordered are listed, but only abnormal results are displayed)  Labs Reviewed  LIPASE, BLOOD - Abnormal; Notable for the following:       Result Value   Lipase 118 (*)    All other components within normal limits  COMPREHENSIVE METABOLIC PANEL - Abnormal; Notable for the following:    Potassium 3.3 (*)    Glucose, Bld 107 (*)    All other components within normal limits  CBC - Abnormal; Notable for the following:    RDW 15.3 (*)    All other components within normal limits  URINALYSIS, COMPLETE (UACMP) WITH MICROSCOPIC - Abnormal; Notable for the following:    Color, Urine YELLOW (*)    APPearance CLEAR (*)    Hgb urine dipstick SMALL (*)    All other components within normal limits  TROPONIN I   ____________________________________________  EKG  ED ECG REPORT I, Rebecka Apley, the attending physician, personally viewed and interpreted this ECG.   Date: 08/13/2017  EKG Time: 2121  Rate: 61  Rhythm: normal sinus rhythm  Axis: normal  Intervals:none  ST&T Change: none  ____________________________________________  RADIOLOGY  US Abdomen Limited Ruq  Result Date: 08/14/2017 CLINICAL DATA:  Abdominal pain for 4 days EXAM: ULTRASOUND ABDOMEN LIMITED RIGHT UPPER QUADRANT COMPARISON:  None. FINDINGS: Gallbladder: No gallstones or wall thickening visualized. No sonographic Murphy sign noted by sonographer. Common bile duct: Diameter: 3.9 mm Liver: No focal lesion identified. Within normal limits in parenchymal echogenicity. Portal vein is patent on color Doppler  imaging with normal direction of blood flow towards the liver. IMPRESSION: Negative right upper quadrant abdominal ultrasound Electronically Signed   By: Jasmine Pang M.D.   On: 08/14/2017 02:36    ____________________________________________   PROCEDURES  Procedure(s) performed: None  Procedures  Critical Care performed: No  ____________________________________________   INITIAL IMPRESSION / ASSESSMENT AND PLAN / ED COURSE  Pertinent labs & imaging results that were available during my care of the patient were reviewed by me and considered in my medical decision making (see chart for details).  this is a 66 year old female who comes into the hospital today with some abdominal pain. The patient does appear to have pancreatitis according to her blood work but I will send her for an ultrasound to look for biliary disease. She did receive a dose of morphine, Zofran and liter of normal saline.    the patient ultrasound results returned unremarkable. I did give the patient some GI cocktail and Carafate. Her pain  is now between a 1 and a 4. I will give her a dose of Percocet and discharge her home. She is able to drink without vomiting and her pain is improved. We discussed a clear liquid diet for the next 24-48 hours and follow-up with her GI physician. The patient agrees with this plan.  ____________________________________________   FINAL CLINICAL IMPRESSION(S) / ED DIAGNOSES  Final diagnoses:  Abdominal pain  Acute pancreatitis, unspecified complication status, unspecified pancreatitis type  Acute gastritis without hemorrhage, unspecified gastritis type      NEW MEDICATIONS STARTED DURING THIS VISIT:  New Prescriptions   ONDANSETRON (ZOFRAN ODT) 4 MG DISINTEGRATING TABLET    Take 1 tablet (4 mg total) by mouth every 8 (eight) hours as needed for nausea or vomiting.   OXYCODONE-ACETAMINOPHEN (ROXICET) 5-325 MG TABLET    Take 1 tablet by mouth every 6 (six) hours as needed.     SUCRALFATE (CARAFATE) 1 G TABLET    Take 1 tablet (1 g total) by mouth 2 (two) times daily.     Note:  This document was prepared using Dragon voice recognition software and may include unintentional dictation errors.    Rebecka Apley, MD 08/14/17 [phone]

## 2017-08-14 NOTE — Discharge Instructions (Signed)
PLease follow up with your GI physician. Should the pain worsen or you develop and worse symptoms such as vomiting or fever please return to the ED for further evaluation

## 2017-08-14 NOTE — Progress Notes (Signed)
Kim Babich MD 

## 2017-08-14 NOTE — Progress Notes (Signed)
Kim Moeser MD 

## 2017-11-27 DIAGNOSIS — Z8719 Personal history of other diseases of the digestive system: Secondary | ICD-10-CM

## 2017-11-27 HISTORY — DX: Personal history of other diseases of the digestive system: Z87.19

## 2018-02-19 ENCOUNTER — Encounter: Payer: Self-pay | Admitting: Physical Therapy

## 2018-02-19 ENCOUNTER — Ambulatory Visit: Payer: 59 | Admitting: Physical Therapy

## 2018-02-19 ENCOUNTER — Ambulatory Visit: Payer: 59 | Attending: Orthopaedic Surgery | Admitting: Physical Therapy

## 2018-02-19 DIAGNOSIS — M25562 Pain in left knee: Secondary | ICD-10-CM | POA: Insufficient documentation

## 2018-02-19 DIAGNOSIS — M6281 Muscle weakness (generalized): Secondary | ICD-10-CM | POA: Diagnosis not present

## 2018-02-20 NOTE — Therapy (Signed)
[facility]ICINE [address] [place], [place], [address] Phone: [phone]   Fax:  [phone]  Physical Therapy Evaluation  Patient Details  Name: Kim RiggsSylvia Cadet MRN: [id number] Date of Birth: 09/25/1951 Referring Provider: Linus SalmonsStarman, James MD   Encounter Date: 02/19/2018  PT End of Session - 02/19/18 1000    Visit Number  1    Number of Visits  12    Date for PT Re-Evaluation  04/02/18    PT Start Time  0800    PT Stop Time  0900    PT Time Calculation (min)  60 min    Activity Tolerance  Patient tolerated treatment well    Behavior During Therapy  [facility] for tasks assessed/performed       Past Medical History:  Diagnosis Date  . Diabetes mellitus without complication [facility](HCC)     Past Surgical History:  Procedure Laterality Date  . ABDOMINAL HYSTERECTOMY    . BREAST BIOPSY    . CESAREAN SECTION    . HAND ARTHROPLASTY Bilateral     There were no vitals filed for this visit.   Subjective Assessment - 02/19/18 0821    Subjective  Patient reports she is having "twinges" in her left knee with bending and squatting primarily and with prolonged walking.     Pertinent History  Patient reports she [place]    Limitations  Sitting;Lifting;Standing;Walking    Patient Stated Goals  to decrease pain in left knee with activity    Currently in Pain?  Yes    Pain Score  2  best 0/10; worst 8/10 with bending knee     Pain Location  Knee    Pain Orientation  Left    Pain Descriptors / Indicators  Aching    Pain Type  Acute pain    Pain Onset  More than a month ago    Pain Frequency  Intermittent         [facility] PT Assessment - 02/19/18 0813      Assessment   Medical Diagnosis  left knee pain    Referring Provider  Linus SalmonsStarman, James MD    Onset Date/Surgical Date  12/14/17    Hand Dominance  Right    Prior Therapy  None      Precautions   Precautions  None      Balance Screen   Has the patient fallen in the past 6 months  No      Home Environment   Living Environment  Private residence    Living Arrangements  Spouse/significant other    Type of Home  House    Home Access  Stairs to enter    Entrance Stairs-Number of Steps  3    Entrance Stairs-Rails  Can reach both    Home Layout  One level      Prior Function   Level of Independence  Independent    Vocation  Full time employment    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 to sit on a high chair, walking,  lifting, and bending     Leisure  Gardening, estate [employer] pilotsales,       Cognition   Overall Cognitive Status  Within Functional Limits for tasks assessed      Observation/Other Assessments   Focus on Therapeutic Outcomes (FOTO)   51.38      ROM / Strength   AROM / PROM / Strength  AROM;Strength  AROM   Overall AROM Comments  bilateral LE's grossly WNL all joints/planes      Strength   Overall Strength Comments  right LE hip, knee, ankle WNL major muscle groups; left LE hip flexion 4/5, knee extension 4/5, knee flexion 4/5 as compared to right LE      Palpation   Palpation comment  point tender medial aspect left knee        Objective measurements completed on examination: See above findings.   Treatment: Modalities: Ultrasound: 3 MHz @ 1.2 w/cm2 x 10 min to left knee medial aspect with patient supine lying with LE supported on pillow Electrical stimulation: Russian stim. 10/10 cycle applied (2) electrodes to quadriceps/VMO left LE with patient reclined with LE supported on pillow; pt. Performing quad sets with each cycle; goal muscle re education  Therapeutic exercise: see patient education  Patient response to treatment: patient demonstrated improved technique with exercises with minimal VC for correct alignment. Patient with decreased pain from 2/10 to < 1/10  Improved motor control with repetition and cuing, following estim.    PT Education - 02/19/18 1337    Education provided   Yes    Education Details  HEP: hamstring stretching seated; seated SLR; hip adduction with ball and glute sets; hip abduction with resistive band (clamshells in sitting)    Person(s) Educated  Patient    Methods  Explanation;Demonstration;Verbal cues;Handout    Comprehension  Verbalized understanding;Returned demonstration;Verbal cues required          PT Long Term Goals - 02/19/18 1346      PT LONG TERM GOAL #1   Title  Patient FOTO score will improve to 60/100 demonstrating improved function with daily tasks with less difficulty     Baseline  FOTO 51/100    Status  New    Target Date  03/12/18      PT LONG TERM GOAL #2   Title  Patient FOTO score will improve to 70/100 demonstrating improved function with daily tasks with less difficulty     Baseline  FOTO 51    Status  New    Target Date  04/02/18      PT LONG TERM GOAL #3   Title  Patient will be independent with home exercise program for exercise and pain control in order to self manage at time of discharge    Baseline  patient has limited knowledge of appropriate exercises and progression without assistance and cuing    Status  New    Target Date  04/02/18             Plan - 02/19/18 1000    Clinical Impression Statement  Patient is a 67 year old female who presents with left knee pain that began january 2019 and is improving at this time. She has decreased strength throughout left LE and limited knowledge of appropriate exercises and progression and will benefit from physical therapy intervention to achieve goals. Her FOTO score of 51/100 indicated mild to moderate self perceived difficulty with most activities and her chief concern is squatting and prolonged walking.     History and Personal Factors relevant to plan of care:  Patient reports she injured [place] 12[place]    Clinical Presentation  Stable    Clinical Decision Making  Low    Rehab Potential  Good    Clinical Impairments  Affecting Rehab Potential  (+)motivated, acute condition    PT Frequency  -- 1-2x/week  PT Duration  6 weeks    PT Treatment/Interventions  Cryotherapy;Electrical Stimulation;Iontophoresis 4mg /ml Dexamethasone;Moist Heat;Therapeutic exercise;Therapeutic activities;Ultrasound;Neuromuscular re-education;Patient/family education;Manual techniques    PT Next Visit Plan  modalities for pain control, muscle re education for quadriceps    PT Home Exercise Plan  per education     Consulted and Agree with Plan of Care  Patient       Patient will benefit from skilled therapeutic intervention in order to improve the following deficits and impairments:  Impaired perceived functional ability, Decreased strength, Decreased endurance, Decreased activity tolerance  Visit Diagnosis: Muscle weakness (generalized) - Plan: PT plan of care cert/re-cert  Acute pain of left knee - Plan: PT plan of care cert/re-cert     Problem List There are no active problems to display for this patient.   Beacher May PT 02/20/2018, 4:06 PM  [facility] PHYSICAL AND SPORTS MEDICINE [address] [place], [address] Phone: [phone]   Fax:  [phone]  Name: Rashea Hoskie MRN: [id number] Date of Birth: [birth year]

## 2018-02-26 ENCOUNTER — Ambulatory Visit: Payer: 59 | Attending: Orthopaedic Surgery | Admitting: Physical Therapy

## 2018-02-26 ENCOUNTER — Ambulatory Visit: Payer: 59 | Admitting: Physical Therapy

## 2018-02-26 ENCOUNTER — Encounter: Payer: Self-pay | Admitting: Physical Therapy

## 2018-02-26 DIAGNOSIS — M6281 Muscle weakness (generalized): Secondary | ICD-10-CM | POA: Diagnosis not present

## 2018-02-26 DIAGNOSIS — M25562 Pain in left knee: Secondary | ICD-10-CM | POA: Diagnosis present

## 2018-02-27 NOTE — Therapy (Signed)
[facility] [address] [place], [address] Phone: [phone]   Fax:  [phone]  Physical Therapy Treatment  Patient Details  Name: Zoee Heeney MRN: [id number] Date of Birth: 1950/11/30 Referring Provider: Linus Salmons MD   Encounter Date: 02/26/2018  PT End of Session - 02/26/18 1026    Visit Number  2    Number of Visits  12    Date for PT Re-Evaluation  04/02/18    PT Start Time  0948    PT Stop Time  1030    PT Time Calculation (min)  42 min    Activity Tolerance  Patient tolerated treatment well    Behavior During Therapy  [facility] for tasks assessed/performed       Past Medical History:  Diagnosis Date  . Diabetes mellitus without complication [facility])     Past Surgical History:  Procedure Laterality Date  . ABDOMINAL HYSTERECTOMY    . BREAST BIOPSY    . CESAREAN SECTION    . HAND ARTHROPLASTY Bilateral     There were no vitals filed for this visit.  Subjective Assessment - 02/26/18 0953    Subjective  Patient reports she had increased swelling and pain in left knee the day following previous session. She would like to try [place] and estim again.     Pertinent History  Patient reports she injured knee 12/14/[place]    Limitations  Sitting;Lifting;Standing;Walking    Patient Stated Goals  to decrease pain in left knee with activity    Currently in Pain?  No/denies + tender over quadriceps tendon with palpation        Objective: Observation/palpation: mild swelling left knee with point tenderness over patella tendon just inferior to patella  Treatment:  Manual therapy: 8 min goal: improved soft tissue elasticity, decrease pain STM to quadriceps tendon left knee, superficial and cross friction techniques with patient educated in self treatment at home 5 min./day  Modalities: Electrical stimulation: 20 min; Russian stim. 10/10 cycle applied (2) electrodes to  quadriceps/VMO left LE with patient reclined with LE supported on pillow; pt. Performing quad sets/knee extension  with each cycle; goal muscle re education High volt estim.clincial program for muscle spasms  (2) electrodes applied to medial and lateral aspect left knee intensity to tolerance with patient in reclined position with LE supported on pillow goal: pain, spasms  Ultrasound: 10 min goal: pain, inflammation Left knee anterior aspect over quadriceps tendon pulsed 50% x 10 min 1.2w/cm2; patient reclined with left LE on pillow  Patient response to treatment: Patient with decreased tenderness to 0/10 following session. Improved motor control following estim and patient reported less discomfort in left knee with ambulation following treatment    PT Education - 02/26/18 1025    Education provided  Yes    Education Details  HEP: re assessed for continued exercises for home    Person(s) Educated  Patient    Methods  Explanation    Comprehension  Verbalized understanding          PT Long Term Goals - 02/19/18 1346      PT LONG TERM GOAL #1   Title  Patient FOTO score will improve to 60/100 demonstrating improved function with daily tasks with less difficulty     Baseline  FOTO 51/100    Status  New    Target Date  03/12/18      PT LONG TERM GOAL #2  Title  Patient FOTO score will improve to 70/100 demonstrating improved function with daily tasks with less difficulty     Baseline  FOTO 51    Status  New    Target Date  04/02/18      PT LONG TERM GOAL #3   Title  Patient will be independent with home exercise program for exercise and pain control in order to self manage at time of discharge    Baseline  patient has limited knowledge of appropriate exercises and progression without assistance and cuing    Status  New    Target Date  04/02/18            Plan - 02/26/18 1031    Clinical Impression Statement  Patient responded well to treatment with decreased  tenderness over left knee following US. improved motor control with quadriceps following estim. and she is progressing with exercises with moderate cuing and guidance.     Rehab Potential  Good    Clinical Impairments Affecting Rehab Potential  (+)motivated, acute condition    PT Frequency  -- 1-2x/week    PT Duration  6 weeks    PT Treatment/Interventions  Cryotherapy;Electrical Stimulation;Iontophoresis 4mg /ml Dexamethasone;Moist Heat;Therapeutic exercise;Therapeutic activities;Ultrasound;Neuromuscular re-education;Patient/family education;Manual techniques    PT Next Visit Plan  modalities for pain control, muscle re education for quadriceps    PT Home Exercise Plan  hip exercises: adduction with ball and glute sets, Hamstring stretching, SLR, hip abduction with resistive band       Patient will benefit from skilled therapeutic intervention in order to improve the following deficits and impairments:  Impaired perceived functional ability, Decreased strength, Decreased endurance, Decreased activity tolerance  Visit Diagnosis: Muscle weakness (generalized)  Acute pain of left knee     Problem List There are no active problems to display for this patient.   Beacher MayBrooks, Marie PT 02/27/2018, 8:34 AM  [facility] [address] [place], [place], [address] Phone: [phone]   Fax:  [phone]  Name: Bobbye RiggsSylvia Heiberger MRN: [id number] Date of Birth: [birth year]

## 2018-03-05 ENCOUNTER — Encounter: Payer: 59 | Admitting: Physical Therapy

## 2018-03-05 ENCOUNTER — Encounter: Payer: Self-pay | Admitting: Physical Therapy

## 2018-03-05 ENCOUNTER — Ambulatory Visit: Payer: 59 | Admitting: Physical Therapy

## 2018-03-05 DIAGNOSIS — M6281 Muscle weakness (generalized): Secondary | ICD-10-CM | POA: Diagnosis not present

## 2018-03-05 DIAGNOSIS — M25562 Pain in left knee: Secondary | ICD-10-CM

## 2018-03-05 NOTE — Therapy (Signed)
[place] [facility] [address], [place], [address] Phone: [phone]   Fax:  [phone]  Physical Therapy Treatment  Patient Details  Name: Kim Lowery MRN: [id number] Date of Birth: [birth year] Referring Provider: Linus Salmons MD   Encounter Date: 03/05/2018  PT End of Session - 03/05/18 0948    Visit Number  3    Number of Visits  12    Date for PT Re-Evaluation  04/02/18    PT Start Time  0945    PT Stop Time  1028    PT Time Calculation (min)  43 min    Activity Tolerance  Patient tolerated treatment well    Behavior During Therapy  [facility] for tasks assessed/performed       Past Medical History:  Diagnosis Date  . Diabetes mellitus without complication [facility])     Past Surgical History:  Procedure Laterality Date  . ABDOMINAL HYSTERECTOMY    . BREAST BIOPSY    . CESAREAN SECTION    . HAND ARTHROPLASTY Bilateral     There were no vitals filed for this visit.  Subjective Assessment - 03/05/18 0947    Subjective  Patient reports she is improving and feels her knee is 80% better.     Pertinent History  Patient reports she injured knee 12/14[place]    Limitations  Sitting;Lifting;Standing;Walking    Patient Stated Goals  to decrease pain in left knee with activity    Currently in Pain?  No/denies quadriceps tendon          Objective: Observation/palpation: mild swelling left knee with point tenderness over patella tendon just inferior and medial to patella   Treatment:  Manual therapy: 3 min goal: improved soft tissue elasticity, decrease pain STM to quadriceps tendon left knee, superficial and cross friction techniques with patient educated in self treatment at home 5 min./day   Modalities:  Ultrasound: 10 min goal: pain, inflammation Left knee anterior aspect over quadriceps tendon pulsed 50% x 10 min 1.2w/cm2; patient reclined with left LE on pillow  therapeutic  exercise: patient performed with instruction, demonstration and verbal cues of therapist: goal: indpendent with home program and self management side lying: clamshells x 10 each LE  prone: hip extension x 10  hip extension with knee flexed to 90 x 10  standing: side stepping with green resistive band around thighs x 2 min. diagonal "wedding march" with 4# weights in hands x 2 min   Patient response to treatment: improved technique with exercises with repeated demonstration, verbal cues and repetition; left hip extension and extension with knee flexion decreased as compared to right LE    PT Education - 03/05/18 1034    Education provided  Yes    Education Details  HEP re asssessed with added exercises: farmers carry wedding march on diagonal, side lying clam, hip abduction, prone hip extension and hip extension with knee flexed     Person(s) Educated  Patient    Methods  Explanation;Demonstration;Verbal cues    Comprehension  Verbal cues required;Returned demonstration;Verbalized understanding          PT Long Term Goals - 02/19/18 1346      PT LONG TERM GOAL #1   Title  Patient FOTO score will improve to 60/100 demonstrating improved function with daily tasks with less difficulty     Baseline  FOTO 51/100    Status  New    Target Date  03/12/18  PT LONG TERM GOAL #2   Title  Patient FOTO score will improve to 70/100 demonstrating improved function with daily tasks with less difficulty     Baseline  FOTO 51    Status  New    Target Date  04/02/18      PT LONG TERM GOAL #3   Title  Patient will be independent with home exercise program for exercise and pain control in order to self manage at time of discharge    Baseline  patient has limited knowledge of appropriate exercises and progression without assistance and cuing    Status  New    Target Date  04/02/18            Plan - 03/05/18 0949    Clinical Impression Statement  Patient is progressing well with  goals and imrpoving function.  She continues with knee pain, left LE weakness and requires guidance for appropriate exercise progression and will benefit from continued physical therapy intervention.    Rehab Potential  Good    Clinical Impairments Affecting Rehab Potential  (+)motivated, acute condition    PT Frequency  -- 1-2x/week    PT Duration  6 weeks    PT Treatment/Interventions  Cryotherapy;Electrical Stimulation;Iontophoresis 4mg /ml Dexamethasone;Moist Heat;Therapeutic exercise;Therapeutic activities;Ultrasound;Neuromuscular re-education;Patient/family education;Manual techniques    PT Next Visit Plan  modalities for pain control, muscle re education for quadriceps    PT Home Exercise Plan  hip exercises: adduction with ball and glute sets, Hamstring stretching, SLR, hip abduction with resistive band       Patient will benefit from skilled therapeutic intervention in order to improve the following deficits and impairments:  Impaired perceived functional ability, Decreased strength, Decreased endurance, Decreased activity tolerance  Visit Diagnosis: Muscle weakness (generalized)  Acute pain of left knee     Problem List There are no active problems to display for this patient.   Beacher MayBrooks, Daiel Strohecker PT 03/06/2018, 9:36 AM  [facility] [address] [place], [place], [address] Phone: [phone]   Fax:  [phone]  Name: Kim RiggsSylvia Lowery MRN: [id number] Date of Birth: 1951/02/09

## 2018-03-07 ENCOUNTER — Encounter: Payer: 59 | Admitting: Physical Therapy

## 2018-03-12 ENCOUNTER — Encounter: Payer: 59 | Admitting: Physical Therapy

## 2018-03-12 ENCOUNTER — Ambulatory Visit: Payer: 59 | Admitting: Physical Therapy

## 2018-03-12 ENCOUNTER — Encounter: Payer: Self-pay | Admitting: Physical Therapy

## 2018-03-12 DIAGNOSIS — M6281 Muscle weakness (generalized): Secondary | ICD-10-CM

## 2018-03-12 DIAGNOSIS — M25562 Pain in left knee: Secondary | ICD-10-CM

## 2018-03-12 NOTE — Therapy (Signed)
[place] [facility] [address] [place], [place], [address] Phone: [phone]   Fax:  [phone]  Physical Therapy Treatment  Patient Details  Name: Kim RiggsSylvia Lowery MRN: [id number] Date of Birth: August 03, 1951 Referring Provider: Linus SalmonsStarman, James MD   Encounter Date: 03/12/2018  PT End of Session - 03/12/18 1125    Visit Number  4    Number of Visits  12    Date for PT Re-Evaluation  04/02/18    PT Start Time  1120    PT Stop Time  1200    PT Time Calculation (min)  40 min    Activity Tolerance  Patient tolerated treatment well    Behavior During Therapy  [facility] for tasks assessed/performed       Past Medical History:  Diagnosis Date  . Diabetes mellitus without complication [facility](HCC)     Past Surgical History:  Procedure Laterality Date  . ABDOMINAL HYSTERECTOMY    . BREAST BIOPSY    . CESAREAN SECTION    . HAND ARTHROPLASTY Bilateral     There were no vitals filed for this visit.  Subjective Assessment - 03/12/18 1123    Subjective  Patient reports she is continuing to see improvement with left knee. She was on vacation and walked quite a bit and did well.     Pertinent History  Patient reports she injured knee 12/14[place]    Limitations  Sitting;Lifting;Standing;Walking    Patient Stated Goals  to decrease pain in left knee with activity    Currently in Pain?  Other (Comment) left quadriceps tendon          Objective: Observation/palpation: mild warmth, increased soft tissue thickness left knee patella tendon  Treatment:  Manual therapy: 8 min goal: improved soft tissue elasticity, decrease pain STM to quadriceps tendon left knee, superficial and cross friction techniques with patient educated in self treatment at home 5 min./day  Modalities: Ultrasound: 10 min goal: pain, inflammation Left knee anterior aspect over quadriceps tendon 3MHz pulsed 50% x 10 min 1.2w/cm2; patient reclined  with left LE on pillow  therapeutic exercise: patient performed with instruction, demonstration and verbal cues of therapist: goal: indpendent with home program and self management side lying: Clamshells demonstrated by patient with good technique  prone: hip extension x 3 hip extension with knee flexed to 90 x 3  standing: Tap balance stone for hip abduction and extension bilaterally 2 x 10 reps each Side stepping along balance stones x 2 min   Patient response to treatment: improved technique with exercises following demonstration and with minimal cuing. Decreased warmth and improved soft tissue elaticity following STM/US.        PT Education - 03/12/18 1200    Education provided  Yes    Education Details  HEP: balnce stone exercises instruction    Person(s) Educated  Patient    Methods  Explanation;Demonstration;Verbal cues    Comprehension  Verbalized understanding;Returned demonstration;Verbal cues required          PT Long Term Goals - 02/19/18 1346      PT LONG TERM GOAL #1   Title  Patient FOTO score will improve to 60/100 demonstrating improved function with daily tasks with less difficulty     Baseline  FOTO 51/100    Status  New    Target Date  03/12/18      PT LONG TERM GOAL #2   Title  Patient FOTO score will improve to  70/100 demonstrating improved function with daily tasks with less difficulty     Baseline  FOTO 51    Status  New    Target Date  04/02/18      PT LONG TERM GOAL #3   Title  Patient will be independent with home exercise program for exercise and pain control in order to self manage at time of discharge    Baseline  patient has limited knowledge of appropriate exercises and progression without assistance and cuing    Status  New    Target Date  04/02/18            Plan - 03/12/18 1212    Clinical Impression Statement  Patient is progressing well with decreased pain in left knee and improving function with daily activities.  She is progressing well with exercises and should continue with additional physical therapy intervention.     Rehab Potential  Good    Clinical Impairments Affecting Rehab Potential  (+)motivated, acute condition    PT Frequency  -- 1-2x/week    PT Duration  6 weeks    PT Treatment/Interventions  Cryotherapy;Electrical Stimulation;Iontophoresis 4mg /ml Dexamethasone;Moist Heat;Therapeutic exercise;Therapeutic activities;Ultrasound;Neuromuscular re-education;Patient/family education;Manual techniques    PT Next Visit Plan  modalities for pain control, muscle re education for quadriceps    PT Home Exercise Plan  hip exercises: adduction with ball and glute sets, Hamstring stretching, SLR, hip abduction with resistive band       Patient will benefit from skilled therapeutic intervention in order to improve the following deficits and impairments:  Impaired perceived functional ability, Decreased strength, Decreased endurance, Decreased activity tolerance  Visit Diagnosis: Muscle weakness (generalized)  Acute pain of left knee     Problem List There are no active problems to display for this patient.   Beacher May PT 03/12/2018, 11:18 PM  [place] [facility] PHYSICAL AND SPORTS MEDICINE [address], [place], [address] Phone: [phone]   Fax:  [phone]  Name: Kim Lowery MRN: [id number] Date of Birth: Nov 30, 1950

## 2018-03-21 ENCOUNTER — Encounter: Payer: 59 | Admitting: Physical Therapy

## 2018-03-26 ENCOUNTER — Encounter: Payer: 59 | Admitting: Physical Therapy

## 2018-03-26 ENCOUNTER — Ambulatory Visit: Payer: 59 | Admitting: Physical Therapy

## 2018-03-26 DIAGNOSIS — M6281 Muscle weakness (generalized): Secondary | ICD-10-CM

## 2018-03-26 DIAGNOSIS — M25562 Pain in left knee: Secondary | ICD-10-CM

## 2018-03-26 NOTE — Therapy (Signed)
[place] [facility]ICINE [address] [place], [address] Phone: [phone]   Fax:  [phone]  Physical Therapy Discharge Summary  Patient Details  Name: Kim Lowery MRN: [id number] Date of Birth: Oct 22, 1951 Referring Provider: Linus Salmons MD   Encounter Date: 03/26/2018   Patient began physical therapy on 02/19/2018 and has attended 4 sessions through  03/26/2018. She has achieved all goals and is independent in home program for continued self management of pain/symptoms and exercises as instructed. Plan discharge from physical therapy at this time.    Past Medical History:  Diagnosis Date  . Diabetes mellitus without complication [facility])     Past Surgical History:  Procedure Laterality Date  . ABDOMINAL HYSTERECTOMY    . BREAST BIOPSY    . CESAREAN SECTION    . HAND ARTHROPLASTY Bilateral     There were no vitals filed for this visit.  Subjective Assessment - 03/26/18 1112    Subjective  Patient reports she is doing well and would like to discharge from therapy at this time. She is currently returning to full exercise including squats and lunges and does not have any concerns with activity or knee pain.      Pertinent History  Patient reports she injured knee 12[place]14[place]    Limitations  Sitting;Lifting;Standing;Walking    Patient Stated Goals  to decrease pain in left knee with activity       Re assessed FOTO with score of 99/100 (original score 51/100)    PT Long Term Goals - 03/26/18 1422      PT LONG TERM GOAL #1   Title  Patient FOTO score will improve to 60/100 demonstrating improved function with daily tasks with less difficulty     Baseline  FOTO 51/100; FOTO 99/100 03/26/18    Status  Achieved      PT LONG TERM GOAL #2   Title  Patient FOTO score will improve to 70/100 demonstrating improved function with daily tasks with less difficulty     Baseline  FOTO 51; FOTO 99/100  03/26/18    Status  Achieved      PT LONG TERM GOAL #3   Title  Patient will be independent with home exercise program for exercise and pain control in order to self manage at time of discharge    Baseline  patient has limited knowledge of appropriate exercises and progression without assistance and cuing    Status  Achieved            Plan - 03/26/18 1421    Clinical Impression Statement  Patient has achieved all goals and is independent with home program. She should continue to improve with self management and home program.     Rehab Potential  Good    Clinical Impairments Affecting Rehab Potential  (+)motivated, acute condition    PT Frequency  -- 1-2x/week    PT Duration  6 weeks    PT Treatment/Interventions  Cryotherapy;Electrical Stimulation;Iontophoresis /ml Dexamethasone;Moist Heat;Therapeutic exercise;Therapeutic activities;Ultrasound;Neuromuscular re-education;Patient/family education;Manual techniques    PT Next Visit Plan  discharge     PT Home Exercise Plan  hip exercises: adduction with ball and glute sets, Hamstring stretching, SLR, hip abduction with resistive band    Consulted and Agree with Plan of Care  Patient       Patient will benefit from skilled therapeutic intervention in order to improve the following deficits and impairments:  Impaired perceived functional ability, Decreased strength, Decreased  endurance, Decreased activity tolerance  Visit Diagnosis: Muscle weakness (generalized)  Acute pain of left knee     Problem List There are no active problems to display for this patient.   Beacher May PT 03/26/2018, 2:27 PM  [place] [facility] PH[facility]TS MEDICINE [address], [place], [address] Phone: [phone]   Fax:  [phone]  Name: Kim Lowery MRN: [id number] Date of Birth: 1951/03/21

## 2018-04-09 ENCOUNTER — Encounter: Payer: 59 | Admitting: Physical Therapy

## 2018-04-16 ENCOUNTER — Other Ambulatory Visit: Payer: Self-pay | Admitting: Gastroenterology

## 2018-04-16 DIAGNOSIS — R1011 Right upper quadrant pain: Secondary | ICD-10-CM

## 2018-04-19 ENCOUNTER — Encounter
Admission: RE | Admit: 2018-04-19 | Discharge: 2018-04-19 | Disposition: A | Discharge status: Home / Self Care | Payer: 59 | Source: Ambulatory Visit | Attending: Gastroenterology | Admitting: Gastroenterology

## 2018-04-19 DIAGNOSIS — R1011 Right upper quadrant pain: Secondary | ICD-10-CM | POA: Insufficient documentation

## 2018-04-19 MED ORDER — TECHNETIUM TC 99M MEBROFENIN IV KIT
5.1800 | PACK | Freq: Once | INTRAVENOUS | Status: AC | PRN
  Administered 2018-04-19: 5.18 via INTRAVENOUS

## 2018-04-24 ENCOUNTER — Encounter: Payer: Self-pay | Admitting: *Deleted

## 2018-05-08 ENCOUNTER — Telehealth: Payer: Self-pay | Admitting: General Surgery

## 2018-05-08 NOTE — Telephone Encounter (Signed)
I HAVE CALLED & L/M FOR PATIENT TO CALL BACK TO CONFIRM IF SHE HAS A POA OR IF SHE IS CONFIDENT TO MAKE HER OWN DECISIONS. ON HER NWE PW IT SHOWS SHE'S WORKING,BUT AT THE BOTTOM OF THE DEMOGRAPHIC FORM POA IS CIRCLED WITH HER HUSBAND'S NAME LISTED. PLEASE NOTE IT IN HER APPOINTMENT NOTE & LET MELANIE KNOW

## 2018-05-28 ENCOUNTER — Encounter: Payer: Self-pay | Admitting: General Surgery

## 2018-05-28 ENCOUNTER — Ambulatory Visit (INDEPENDENT_AMBULATORY_CARE_PROVIDER_SITE_OTHER): Payer: 59 | Admitting: General Surgery

## 2018-05-28 VITALS — BP 120/80 | HR 76 | Resp 13 | Ht 63.0 in | Wt 152.0 lb

## 2018-05-28 DIAGNOSIS — R1011 Right upper quadrant pain: Secondary | ICD-10-CM | POA: Diagnosis not present

## 2018-05-28 DIAGNOSIS — K828 Other specified diseases of gallbladder: Secondary | ICD-10-CM | POA: Diagnosis not present

## 2018-05-28 NOTE — Patient Instructions (Addendum)
The patient is aware to call back for any questions, worsening symptoms or new concerns.  Sign record release

## 2018-05-28 NOTE — Progress Notes (Signed)
Patient ID: Kim Lowery, female   DOB: [birth year], 67 y.o.   MRN: [id number]  Chief Complaint  Patient presents with  . Abdominal Pain    HPI Kim Lowery is a 67 y.o. female here today for a evaluation of gallbladder referred by Dr Loreta Ave, GI [facility]. She is a NP. Patient states she had pancreatitis two times, Sepember 2018 and May 2019. During these episodes the pain was right upper quadrant.  The most recent episode occurred while she was at her son's wedding in [place], [place].  She was hospitalized at [facility] where she had a CT scan and HIDA scan completed on 04-19-18. She is no longer having any pain in her abdomen.. No Pain during the stimulation (Ensure) portion of her HIDA scan.   No vomiting or diarrhea.   She has been vegan for 3 years. She has been avoiding high fatty foods and feels this is helping.  Husband of 25 years, Jonny Ruiz is present at visit.  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  Diabetic for less than 10 years.  No history of neuropathy.  A1c is typically run below 7.2.  HPI  Past Medical History:  Diagnosis Date  . Allergy   . Diabetes mellitus without complication (HCC) 2009?   diet controlled  . Hx of pancreatitis 2019  . Hypertension     Past Surgical History:  Procedure Laterality Date  . ABDOMINAL HYSTERECTOMY  1996   Total  . BREAST BIOPSY Left 1972, 1999  . CESAREAN SECTION  1988  . COLONOSCOPY  2018  . HAND ARTHROPLASTY Bilateral 2014    Family History  Problem Relation Age of Onset  . Diabetes Mother   . Diabetes Father   . Gastric cancer Brother     Social History Social History   Tobacco Use  . Smoking status: Never Smoker  . Smokeless tobacco: Never Used  Substance Use Topics  . Alcohol use: Yes    Comment: wine occasionally  . Drug use: No    Allergies  Allergen Reactions  . Avelox [Moxifloxacin Hcl In Nacl]  Nausea And Vomiting  . Other     Pro Air Inhalers-produces Stridor   . Macrobid [Nitrofurantoin Macrocrystal] Rash  . Sulfa Antibiotics Rash    Current Outpatient Medications  Medication Sig Dispense Refill  . aspirin EC 81 MG tablet Take 81 mg by mouth daily.    Marland Kitchen estradiol (CLIMARA) 0.1 mg/24hr patch Place 1 patch onto the skin once a week.    . Estradiol 10 MCG TABS vaginal tablet Place 1 tablet vaginally 2 (two) times a week.    . losartan-hydrochlorothiazide (HYZAAR) 50-12.5 MG tablet Take 1 tablet by mouth daily.    . montelukast (SINGULAIR) 10 MG tablet Take 10 mg by mouth daily.    . Vitamin D, Ergocalciferol, (DRISDOL) 50000 units CAPS capsule      No current facility-administered medications for this visit.     Review of Systems Review of Systems  Constitutional: Negative.   Respiratory: Negative.   Cardiovascular: Negative.     Blood pressure 120/80, pulse 76, resp. rate 13, height 5\' 3"  (1.6 m), weight 152 lb (68.9 kg).  Physical Exam Physical Exam  Constitutional: She is oriented to person, place, and time. She appears well-developed and well-nourished.  HENT:  Mouth/Throat: Oropharynx is clear and moist. No oropharyngeal exudate.  Eyes: Conjunctivae are normal. No scleral icterus.  Neck: Neck supple.  Cardiovascular: Normal rate, regular rhythm, normal heart sounds and intact distal pulses.  No lower leg edema  Pulmonary/Chest: Effort normal and breath sounds normal.  Abdominal: Soft. Normal appearance and bowel sounds are normal. There is no hepatosplenomegaly. There is no tenderness.    Lymphadenopathy:    She has no cervical adenopathy.  Neurological: She is alert and oriented to person, place, and time.  Skin: Skin is warm and dry.  Psychiatric: Her behavior is normal.    Data Reviewed HIDA Scan with stimulation completed Apr 19, 2018 was reviewed.  Normal gallbladder uptake, ejection fraction 12% after 8 ounces of Ensure.  No abdominal  symptoms.  Review of records [place] 16[place], [place] were [place].  She presented with a one-week history of right upper quadrant pain that had become worse the day of presentation.  Described the pain is being worse after eating.  Relief with oral Percocet.  Similar to an episode experienced in September 2018.  EGD at that time showed gastritis.  Pain was rated 10/10.  Laboratory studies showed a normal white blood cell count of 6000, hemoglobin of 12.1 with an MCV of 89, platelet count of 217,000.  Creatinine 0.79.  Normal alkaline phosphatase and serum transaminases.  Bilirubin 0.4.  Lipase 255.  WBC differential was normal with 56% polys.  Normal for the [place] lab for lipase was 51.  Hemoglobin A1c: 6.8.  Urine drug screen negative except for oxycodone.  Symptoms were thought secondary to Januvia which has been held.  Lantus substituted.  Her CT of the abdomen showed slight infiltration of the fat adjacent of the pancreatic head suspicious for pancreatitis.  Mild fatty infiltration of the liver.  Increased colonic stool suggestive of constipation.  Incidental note of a hepatic cyst.  Ultrasound of the gallbladder showed no gallstones, pericholecystic fluid, wall thickening or sonographic Murphy sign.  Notes from Dr. Loreta AveMann of Apr 16, 2018 reviewed.  Serum lipase during that visit 40 (14-72)  Laboratory studies including LDL-P from 2015 through 2017 reviewed.  Peak value of 1351.  Normal HDL.  Normal triglycerides.  Normal cholesterol.  Elevated small LDL-P  December 21, 2017 studies show the LDL-be elevated at 1256.  Small LDL-P elevated at 749.  August 13, 2017 ED evaluation for abdominal pain including CBC, conference of metabolic panel and lipase reviewed.  CBC normal, comprehensive metabolic panel notable for mild hypo-kalemia.  Lipase 118 (11-51).  CT scan of the abdomen and pelvis of August 14, 2017 was reviewed.  Subtle stranding along  the inferior surface of the pancreatic body/tail, possible indicator of early acute pancreatitis.  Evidence of constipation.  Ultrasound the same date: Negative.  Review of up-to-date suggest less than 1% report of acute pancreatitis postmarketing of Januvia.  Assessment    Episodic abdominal pain without clear biliary etiology.  Possible microlithiasis with trigger of mild, transient pancreatitis.    Plan    With no evidence of gallstones and no reproduction of symptoms with the stimulation portion of her HIDA scan, no frank mandate for cholecystectomy at this time.  Microlithiasis can be difficult to prove, and an endoscopic procedure for this would probably not be beneficial.  Should she have recurrent episodes strong consideration can be given to elective cholecystectomy.       HPI, Physical Exam, Assessment and Plan have been scribed under the direction and in the presence of Earline MayotteJeffrey W. Johnwilliam Shepperson, MD. Dorathy DaftMarsha Hatch, RN  Kim Lowery 05/29/2018,  1:58 PM

## 2018-05-29 DIAGNOSIS — R1011 Right upper quadrant pain: Secondary | ICD-10-CM | POA: Insufficient documentation

## 2018-05-29 DIAGNOSIS — K828 Other specified diseases of gallbladder: Secondary | ICD-10-CM | POA: Insufficient documentation

## 2018-06-10 ENCOUNTER — Telehealth: Payer: Self-pay

## 2018-06-10 NOTE — Telephone Encounter (Signed)
Message left for patient informing her that Dr Lemar LivingsByrnett has reviewed her CT and just observation for now. She is aware to call if any problems develop or she has any questions.

## 2018-06-10 NOTE — Telephone Encounter (Signed)
-----   Message from Earline MayotteJeffrey W Byrnett, MD sent at 06/07/2018  4:52 PM EDT ----- Please let them know that I had the opportunity to review the CT [place] from Apr 11, 2018.  No new findings.  No change in plans for observation at this time.

## 2018-06-14 ENCOUNTER — Encounter: Payer: Self-pay | Admitting: Internal Medicine

## 2018-06-14 ENCOUNTER — Ambulatory Visit (INDEPENDENT_AMBULATORY_CARE_PROVIDER_SITE_OTHER): Payer: 59 | Admitting: Internal Medicine

## 2018-06-14 VITALS — BP 142/78 | HR 67 | Ht 63.0 in | Wt 152.8 lb

## 2018-06-14 DIAGNOSIS — E1165 Type 2 diabetes mellitus with hyperglycemia: Secondary | ICD-10-CM

## 2018-06-14 DIAGNOSIS — E785 Hyperlipidemia, unspecified: Secondary | ICD-10-CM | POA: Diagnosis not present

## 2018-06-14 MED ORDER — METFORMIN HCL ER 500 MG PO TB24: 1000.0000 mg | ORAL_TABLET | Freq: Every day | ORAL | 3 refills | Status: DC

## 2018-06-14 MED ORDER — FREESTYLE LIBRE 14 DAY SENSOR MISC: 1.0000 | 11 refills | Status: DC

## 2018-06-14 NOTE — Patient Instructions (Signed)
Please start: - Metformin ER 1000 mg at bedtime  Also restart: - Ezetimibe 10 mg daily - Lovaza 2000 mg at bedtime  Please let me know if the sugars are consistently <80 or >200.  Please return in 3 months with your sugar log or CGM receiver.  PATIENT INSTRUCTIONS FOR TYPE 2 DIABETES:  DIET AND EXERCISE Diet and exercise is an important part of diabetic treatment.  We recommended aerobic exercise in the form of brisk walking (working between 40-60% of maximal aerobic capacity, similar to brisk walking) for 150 minutes per week (such as 30 minutes five days per week) along with 3 times per week performing 'resistance' training (using various gauge rubber tubes with handles) 5-10 exercises involving the major muscle groups (upper body, lower body and core) performing 10-15 repetitions (or near fatigue) each exercise. Start at half the above goal but build slowly to reach the above goals. If limited by weight, joint pain, or disability, we recommend daily walking in a swimming pool with water up to waist to reduce pressure from joints while allow for adequate exercise.    BLOOD GLUCOSES Monitoring your blood glucoses is important for continued management of your diabetes. Please check your blood glucoses 2-4 times a day: fasting, before meals and at bedtime (you can rotate these measurements - e.g. one day check before the 3 meals, the next day check before 2 of the meals and before bedtime, etc.).   HYPOGLYCEMIA (low blood sugar) Hypoglycemia is usually a reaction to not eating, exercising, or taking too much insulin/ other diabetes drugs.  Symptoms include tremors, sweating, hunger, confusion, headache, etc. Treat IMMEDIATELY with 15 grams of Carbs: . 4 glucose tablets .  cup regular juice/soda . 2 tablespoons raisins . 4 teaspoons sugar . 1 tablespoon honey Recheck blood glucose in 15 mins and repeat above if still symptomatic/blood glucose <100.  RECOMMENDATIONS TO REDUCE YOUR RISK  OF DIABETIC COMPLICATIONS: * Take your prescribed MEDICATION(S) * Follow a DIABETIC diet: Complex carbs, fiber rich foods, (monounsaturated and polyunsaturated) fats * AVOID saturated/trans fats, high fat foods, >2,300 mg salt per day. * EXERCISE at least 5 times a week for 30 minutes or preferably daily.  * DO NOT SMOKE OR DRINK more than 1 drink a day. * Check your FEET every day. Do not wear tightfitting shoes. Contact us if you develop an ulcer * See your EYE doctor once a year or more if needed * Get a FLU shot once a year * Get a PNEUMONIA vaccine once before and once after age 38 years  GOALS:  * Your Hemoglobin A1c of <7%  * fasting sugars need to be <130 * after meals sugars need to be <180 (2h after you start eating) * Your Systolic BP should be 140 or lower  * Your Diastolic BP should be 80 or lower  * Your HDL (Good Cholesterol) should be 40 or higher  * Your LDL (Bad Cholesterol) should be 100 or lower. * Your Triglycerides should be 150 or lower  * Your Urine microalbumin (kidney function) should be <30 * Your Body Mass Index should be 25 or lower    Please consider the following ways to cut down carbs and fat and increase fiber and micronutrients in your diet: - substitute whole grain for white bread or pasta - substitute brown rice for white rice - substitute 90-calorie flat bread pieces for slices of bread when possible - substitute sweet potatoes or yams for white potatoes - substitute humus  for margarine - substitute tofu for cheese when possible - substitute almond or rice milk for regular milk (would not drink soy milk daily due to concern for soy estrogen influence on breast cancer risk) - substitute dark chocolate for other sweets when possible - substitute water - can add lemon or orange slices for taste - for diet sodas (artificial sweeteners will trick your body that you can eat sweets without getting calories and will lead you to overeating and weight gain  in the long run) - do not skip breakfast or other meals (this will slow down the metabolism and will result in more weight gain over time)  - can try smoothies made from fruit and almond/rice milk in am instead of regular breakfast - can also try old-fashioned (not instant) oatmeal made with almond/rice milk in am - order the dressing on the side when eating salad at a restaurant (pour less than half of the dressing on the salad) - eat as little meat as possible - can try juicing, but should not forget that juicing will get rid of the fiber, so would alternate with eating raw veg./fruits or drinking smoothies - use as little oil as possible, even when using olive oil - can dress a salad with a mix of balsamic vinegar and lemon juice, for e.g. - use agave nectar, stevia sugar, or regular sugar rather than artificial sweateners - steam or broil/roast veggies  - snack on veggies/fruit/nuts (unsalted, preferably) when possible, rather than processed foods - reduce or eliminate aspartame in diet (it is in diet sodas, chewing gum, etc) Read the labels!  Try to read Dr. Katherina RightNeal Barnard's book: "Program for Reversing Diabetes" for other ideas for healthy eating.

## 2018-06-14 NOTE — Progress Notes (Signed)
Patient ID: Kim RiggsSylvia Lowery, female   DOB: 04/05/1951, 67 y.o.   MRN: [id number]   HPI: Kim RiggsSylvia Lowery is a 67 y.o.-year-old female, referred by her gastroenterologist, Dr. Loreta AveMann, for management of DM2, dx ~2009, non-insulin-dependent, controlled, without long-term complications.  Last hemoglobin A1c was: 06/12/2018 : HbA1c 7.1% 12/21/2017: HbA1c 6.9% 06/08/2017: HbA1c 6.6% No results found for: HGBA1C  Previous HbA1c levels have been between 6 and 7% in the past.  Pt is not on any medications for her diabetes.  She has a history of pancreatitis on 04/11/2018, possibly due to Januvia. She stopped Januvia at that time and also stopped Glucophage DAW 2000 mg daily and  Ezetimibe and Lovaza then.  She has a Freestyle Libre CGM - not using this lately.  Pt checks her sugars 2x a day and they are: - am: 114-135 - 2h after b'fast:  - before lunch: n/c - 2h after lunch: n/c - before dinner: n/c - 2h after dinner: 140-160 - bedtime: n/c - nighttime: n/c  Lowest sugar was 70s (Libre); she has hypoglycemia awareness at 70.  Highest sugar was 200 (2h after dinner - cake).  Glucometer: CGM [employer] libre, now meter for it  Pt's meals are: - Breakfast: Oatmeal, fruit - Lunch: Vegan soup, chili - Dinner: Vegetable stirfry/rice, dahl or mujadara - Snacks: - No exercise lately - usually 2-4 mi walk after dinner.  For exercise, she was walking 3X a week and lifting weights 2X a week  - no CKD, last BUN/creatinine:  12/21/2017: 9/0.8, glucose 129, ALT slightly high, at 42 Lab Results  Component Value Date   BUN 15 08/13/2017   BUN 10 08/05/2013   CREATININE 0.83 08/13/2017   CREATININE 0.75 08/05/2013  On losartan 50.  - + history of HL; last set of lipids: 06/07/2018: 179/210/46/91 12/15/2017: 139/120/44/71 No results found for: CHOL, HDL, LDLCALC, LDLDIRECT, TRIG, CHOLHDL  Prev. On Ezetimibe and Lovaza.   - last eye exam was in 2018. No DR. [place].  - no numbness and tingling in  her feet.  She is on ASA 81.  Pt has FH of DM in mother, father, brother.  She does have a history of vitamin D deficiency.  TFTs were normal 12/21/2017.  She is a Publishing rights managernurse practitioner.  ROS: Constitutional: no weight gain/loss, no fatigue, no subjective hyperthermia/hypothermia Eyes: no blurry vision, no xerophthalmia ENT: no sore throat, no nodules palpated in throat, no dysphagia/odynophagia, no hoarseness Cardiovascular: no CP/SOB/palpitations/leg swelling Respiratory: no cough/SOB Gastrointestinal: no N/V/D/C Musculoskeletal: no muscle/joint aches Skin: no rashes Neurological: no tremors/numbness/tingling/dizziness Psychiatric: no depression/anxiety  Past Medical History:  Diagnosis Date  . Allergy   . Diabetes mellitus without complication (HCC) 2009?   diet controlled  . Hx of pancreatitis 2019  . Hypertension    Past Surgical History:  Procedure Laterality Date  . ABDOMINAL HYSTERECTOMY  1996   Total  . BREAST BIOPSY Left 1972, 1999  . CESAREAN SECTION  1988  . COLONOSCOPY  2018  . HAND ARTHROPLASTY Bilateral 2014   Social History   Socioeconomic History  . Marital status: Married    Spouse name: Not on file  . Number of children: 4  . Years of education: Not on file  . Highest education level: Not on file  Occupational History  . NP  Tobacco Use  . Smoking status: Never Smoker  . Smokeless tobacco: Never Used  Substance and Sexual Activity  . Alcohol use: Yes    Comment: wine occasionally, 1 drink  .  Drug use: No   Current Outpatient Medications on File Prior to Visit  Medication Sig Dispense Refill  . aspirin EC 81 MG tablet Take 81 mg by mouth daily.    Marland Kitchen estradiol (CLIMARA) 0.1 mg/24hr patch Place 1 patch onto the skin once a week.    . losartan-hydrochlorothiazide (HYZAAR) 50-12.5 MG tablet Take 1 tablet by mouth daily.    . montelukast (SINGULAIR) 10 MG tablet Take 10 mg by mouth daily.     Allergies  Allergen Reactions  . Avelox  [Moxifloxacin Hcl In Nacl] Nausea And Vomiting  . Other     Pro Air Inhalers-produces Stridor   . Macrobid [Nitrofurantoin Macrocrystal] Rash  . Sulfa Antibiotics Rash   Family History  Problem Relation Age of Onset  . Diabetes Mother   . Diabetes Father   . Gastric cancer Brother    PE: BP (!) 142/78   Pulse 67   Ht 5\' 3"  (1.6 m)   Wt 152 lb 12.8 oz (69.3 kg)   SpO2 98%   BMI 27.07 kg/m   Wt Readings from Last 3 Encounters:  06/14/18 152 lb 12.8 oz (69.3 kg)  05/28/18 152 lb (68.9 kg)  08/13/17 151 lb (68.5 kg)   Constitutional: Normal weight, in NAD Eyes: PERRLA, EOMI, no exophthalmos ENT: moist mucous membranes, no thyromegaly, no cervical lymphadenopathy Cardiovascular: RRR, No RG, +1/6 SEM Respiratory: CTA B Gastrointestinal: abdomen soft, NT, ND, BS+ Musculoskeletal: no deformities, strength intact in all 4 Skin: moist, warm, no rashes Neurological: no tremor with outstretched hands, DTR normal in all 4  ASSESSMENT: 1. DM2, non-insulin-dependent, controlled, without long-term complications  2. HL  PLAN:  1. Patient with long-standing, controlled diabetes, currently not on an antidiabetic regimen.  She was previously on Januvia and Metfomin (Glucophage), but developed  pancreatitis in 03/2018. - Since her pancreatitis episode, she was also less active, Previously walking 2 to 4 miles a day.  She plans to restart this.  She also relaxed her diet since then.  2 days ago, her HbA1c was checked and was higher than before, at 7.1%.  She mentions that this is the highest she had. - At this visit, we discussed about restarting exercise, and also getting back to her previously vegan diet.  I also made other specific suggestions about improving her diet. - I Also suggested to add back metformin.  She would like to start  Glucophage d.a.w., but she cannot find this anymore so I suggested to start extended release metformin 1000 milligrams; she used to take metformin at bedtime  so we will continue with this.   - I suggested to:  Patient Instructions  Please start: - Metformin ER 1000 mg at bedtime  Also restart: - Ezetimibe 10 mg daily - Lovaza 2000 mg at bedtime  Please let me know if the sugars are consistently <80 or >200.  Please return in 3 months with your sugar log or CGM receiver.  - Strongly advised her to start checking sugars at different times of the day - check once a day, rotating checks - given sugar log and advised how to fill it and to bring it at next appt  - given foot care handout and explained the principles  - given instructions for hypoglycemia management "15-15 rule"  - advised for yearly eye exams  - Return to clinic in 3 mo with sugar log   2. HL - Reviewed most recent lipid panels: After she stopped Zetia and Lovaza, her lipid fractions worsened.   -  We will restart them now.  Carlus Pavlov, MD PhD [facility] End[facility]

## 2018-06-15 ENCOUNTER — Encounter: Payer: Self-pay | Admitting: Internal Medicine

## 2018-06-26 ENCOUNTER — Encounter: Payer: Self-pay | Admitting: Internal Medicine

## 2018-06-26 ENCOUNTER — Encounter: Payer: Self-pay | Admitting: General Surgery

## 2018-06-27 MED ORDER — METFORMIN HCL ER 500 MG PO TB24: 1000.0000 mg | ORAL_TABLET | Freq: Every day | ORAL | 3 refills | Status: DC

## 2018-07-08 ENCOUNTER — Other Ambulatory Visit: Payer: Self-pay | Admitting: Gastroenterology

## 2018-07-08 ENCOUNTER — Ambulatory Visit
Admission: RE | Admit: 2018-07-08 | Discharge: 2018-07-08 | Disposition: A | Discharge status: Home / Self Care | Payer: Self-pay | Source: Ambulatory Visit | Attending: General Surgery | Admitting: General Surgery

## 2018-07-08 ENCOUNTER — Other Ambulatory Visit: Payer: Self-pay | Admitting: General Surgery

## 2018-07-08 DIAGNOSIS — R1011 Right upper quadrant pain: Secondary | ICD-10-CM

## 2018-07-12 ENCOUNTER — Encounter: Payer: Self-pay | Admitting: Internal Medicine

## 2018-07-15 ENCOUNTER — Other Ambulatory Visit: Payer: Self-pay | Admitting: Internal Medicine

## 2018-07-15 MED ORDER — METFORMIN HCL ER 500 MG PO TB24: 1000.0000 mg | ORAL_TABLET | Freq: Two times a day (BID) | ORAL | 4 refills | Status: DC

## 2018-08-30 ENCOUNTER — Other Ambulatory Visit: Payer: Self-pay | Admitting: Physician Assistant

## 2018-08-30 ENCOUNTER — Ambulatory Visit
Admission: RE | Admit: 2018-08-30 | Discharge: 2018-08-30 | Disposition: A | Discharge status: Home / Self Care | Payer: 59 | Source: Ambulatory Visit | Attending: Physician Assistant | Admitting: Physician Assistant

## 2018-08-30 DIAGNOSIS — G44001 Cluster headache syndrome, unspecified, intractable: Secondary | ICD-10-CM | POA: Diagnosis not present

## 2018-09-24 ENCOUNTER — Ambulatory Visit: Payer: 59 | Admitting: Internal Medicine

## 2018-10-02 ENCOUNTER — Encounter: Payer: Self-pay | Admitting: Internal Medicine

## 2018-10-04 NOTE — Telephone Encounter (Signed)
Pt was set up by Delorise Shiner for an appt on 11/15. Placed a call to discuss the referrals just have not received a call back yet.

## 2018-10-11 ENCOUNTER — Ambulatory Visit (INDEPENDENT_AMBULATORY_CARE_PROVIDER_SITE_OTHER): Payer: 59 | Admitting: Internal Medicine

## 2018-10-11 ENCOUNTER — Encounter: Payer: Self-pay | Admitting: Internal Medicine

## 2018-10-11 VITALS — BP 120/80 | HR 61 | Ht 63.0 in | Wt 162.0 lb

## 2018-10-11 DIAGNOSIS — E663 Overweight: Secondary | ICD-10-CM | POA: Diagnosis not present

## 2018-10-11 DIAGNOSIS — E785 Hyperlipidemia, unspecified: Secondary | ICD-10-CM | POA: Insufficient documentation

## 2018-10-11 DIAGNOSIS — E1165 Type 2 diabetes mellitus with hyperglycemia: Secondary | ICD-10-CM | POA: Diagnosis not present

## 2018-10-11 LAB — POCT GLYCOSYLATED HEMOGLOBIN (HGB A1C): HEMOGLOBIN A1C: 6.7 % — AB (ref 4.0–5.6)

## 2018-10-11 NOTE — Progress Notes (Signed)
Patient ID: Kim Lowery, female   DOB: 1951/07/14, 67 y.o.   MRN: [id number]   HPI: Kim Lowery is a 67 y.o.-year-old female, initially referred by her gastroenterologist, Dr. Loreta Ave, returning for follow-up for DM2, dx ~2009, non-insulin-dependent, fairly well controlled, without long-term complications.  Last visit 4 months ago.  Last hemoglobin A1c was: 06/12/2018 : HbA1c 7.1% 12/21/2017: HbA1c 6.9% 06/08/2017: HbA1c 6.6% No results found for: HGBA1C  Previous HbA1c levels have been between 6 and 7% in the past.  At last visit, she was not on any medications for her diabetes.  We started: - Metformin ER 1000 >> 2000 mg at bedtime  She has a history of pancreatitis on 04/11/2018, possibly due to Januvia. She stopped Januvia at that time and also stopped Glucophage DAW 2000 mg daily and  Ezetimibe and Lovaza then.  She checks her sugars with her freestyle libre CGM: - am: 114-135 >> 114-140, 152 (sweet potato pie) - 2h after b'fast:  - before lunch: n/c - 2h after lunch: n/c - before dinner: n/c - 2h after dinner: 140-160 >> 175, 187, 210 - bedtime: n/c - nighttime: n/c  Lowest sugar was 70s (Libre) >> 114, she has hypoglycemia awareness in the 48s.  Highest sugar was 200 (2h after dinner - cake) >> 210.  Glucometer: CGM Freestyle libre  Pt's meals are: - Breakfast: Oatmeal, fruit - Lunch: Vegan soup, chili - Dinner: Vegetable stirfry/rice, dahl or mujadara - Snacks: - Before her pancreatitis episode, she was walking 2 to 4 miles 3 times a week and lifting weights twice a week.  She restarted exercise after last visit.  -No CKD, last BUN/creatinine:  08/30/2018: 13/0.9, GFR 76, glucose 131 AST/ALT high, at 43/61 12/21/2017: 9/0.8, glucose 129, ALT slightly high, at 42 Lab Results  Component Value Date   BUN 15 08/13/2017   BUN 10 08/05/2013   CREATININE 0.83 08/13/2017   CREATININE 0.75 08/05/2013  On losartan 50.  -+ HL; last set of lipids: 06/07/2018:  179/210/46/91 12/15/2017: 139/120/44/71 No results found for: CHOL, HDL, LDLCALC, LDLDIRECT, TRIG, CHOLHDL  We restarted ezetimibe and Lovaza last visit.   - last eye exam was in 07/2018 [facility] -reviewed Dr. Rexford Maus note): No DR, + cataract  - no numbness and tingling in her feet.  She is on ASA 81.  Pt has FH of DM in mother, father, brother.  She has a history of vitamin D deficiency. No results found for: VD25OH   TSH normal: 08/30/2018: TSH 2.277 No results found for: TSH    She is a Publishing rights manager.  ROS: Constitutional: + weight gain/no weight loss, no fatigue, no subjective hyperthermia, no subjective hypothermia Eyes: no blurry vision, no xerophthalmia ENT: no sore throat, no nodules palpated in neck, no dysphagia, no odynophagia, no hoarseness Cardiovascular: no CP/no SOB/no palpitations/no leg swelling Respiratory: no cough/no SOB/no wheezing Gastrointestinal: no N/no V/no D/no C/no acid reflux Musculoskeletal: no muscle aches/no joint aches Skin: no rashes, no hair loss Neurological: no tremors/no numbness/no tingling/no dizziness  I reviewed pt's medications, allergies, PMH, social hx, family hx, and changes were documented in the history of present illness. Otherwise, unchanged from my initial visit note.  Past Medical History:  Diagnosis Date  . Allergy   . Diabetes mellitus without complication (HCC) 2009?   diet controlled  . Hx of pancreatitis 2019  . Hypertension    Past Surgical History:  Procedure Laterality Date  . ABDOMINAL HYSTERECTOMY  1996   Total  . BREAST BIOPSY Left  1972, 1999  . CESAREAN SECTION  1988  . COLONOSCOPY  2018  . HAND ARTHROPLASTY Bilateral 2014   Social History   Socioeconomic History  . Marital status: Married    Spouse name: Not on file  . Number of children: 4  . Years of education: Not on file  . Highest education level: Not on file  Occupational History  . NP  Tobacco Use  . Smoking status:  Never Smoker  . Smokeless tobacco: Never Used  Substance and Sexual Activity  . Alcohol use: Yes    Comment: wine occasionally, 1 drink  . Drug use: No   Current Outpatient Medications  Medication Sig Dispense Refill  . aspirin EC 81 MG tablet Take 81 mg by mouth daily.    . Azelastine-Fluticasone (DYMISTA) 137-50 MCG/ACT SUSP Place into the nose.    . Continuous Blood Gluc Sensor (FREESTYLE LIBRE 14 DAY SENSOR) MISC 1 each by Does not apply route every 14 (fourteen) days. Change every 2 weeks 2 each 11  . estradiol (CLIMARA) 0.1 mg/24hr patch Place 1 patch onto the skin once a week.    . Estradiol 10 MCG TABS vaginal tablet Place 1 tablet vaginally 2 (two) times a week.    . losartan-hydrochlorothiazide (HYZAAR) 50-12.5 MG tablet Take 1 tablet by mouth daily.    . metFORMIN (GLUCOPHAGE-XR) 500 MG 24 hr tablet Take 2 tablets (1,000 mg total) by mouth 2 (two) times daily with a meal. 360 tablet 4  . montelukast (SINGULAIR) 10 MG tablet Take 10 mg by mouth daily.    . Testosterone 10 MG/ACT (2%) GEL Place onto the skin.    Marland Kitchen TRIMETHOPRIM PO Take 100 mg by mouth.    . Vitamin D, Ergocalciferol, (DRISDOL) 50000 units CAPS capsule      No current facility-administered medications for this visit.     Allergies  Allergen Reactions  . Avelox [Moxifloxacin Hcl In Nacl] Nausea And Vomiting  . Other     Pro Air Inhalers-produces Stridor   . Macrobid [Nitrofurantoin Macrocrystal] Rash  . Sulfa Antibiotics Rash   Family History  Problem Relation Age of Onset  . Diabetes Mother   . Diabetes Father   . Gastric cancer Brother    PE: BP 120/80   Pulse 61   Ht 5\' 3"  (1.6 m) Comment: measured  Wt 162 lb (73.5 kg)   SpO2 97%   BMI 28.70 kg/m  Wt Readings from Last 3 Encounters:  10/11/18 162 lb (73.5 kg)  06/14/18 152 lb 12.8 oz (69.3 kg)  05/28/18 152 lb (68.9 kg)   Constitutional: Slightly overweight, in NAD Eyes: PERRLA, EOMI, no exophthalmos ENT: moist mucous membranes, no  thyromegaly, no cervical lymphadenopathy Cardiovascular: RRR, No MRG Respiratory: CTA B Gastrointestinal: abdomen soft, NT, ND, BS+ Musculoskeletal: no deformities, strength intact in all 4 Skin: moist, warm, no rashes Neurological: no tremor with outstretched hands, DTR normal in all 4  ASSESSMENT: 1. DM2, non-insulin-dependent, fairly well controlled, without long-term complications, but with hyperglycemia  2. HL  3.  Overweight  PLAN:  1. Patient with long-standing, uncontrolled, type 2 diabetes, not on any antidiabetic regimen at last visit, but in the past on Januvia and metformin.  However, she developed pancreatitis 03/2018 so she cannot use Januvia anymore.  At last visit, HbA1c was higher than before, at 7.1%, reportedly the highest HbA1c she ever had.  At that time, she was planning to restart walking, as she previously was walking 2 to 4 miles a day.  She also was planning to restart the vegan diet and I made specific suggestions about how to do this.  At that time, we also restarted metformin ER at half maximal dose (she preferred Glucophage d.a.w. but she could not find this in pharmacies anymore). - At this visit, she continues metformin ER, but she increased the dose to 2000 mg at bedtime - She noticed that her sugar started to increase in the last weeks and she is aware that she needs a change in diet.  We discussed about healthy changes and mostly about the benefits of a plant-based diet.  I made specific suggestions about different meals. - We discussed about possibly using glipizide as needed for larger meals, but she would like to start working on her diet and also restarting walking in the morning.  She is also not keen on adding a medicine that she knows would impact the pancreas.  We can also try an SGLT2 inhibitor at next visit, if needed.  For now, we decided to continue only with metformin ER, but I advised her to try to move it to dinnertime, rather than bedtime to see  if this will help a.m. sugars.. - I suggested to:  Patient Instructions  Please continue: - Metformin ER 2000 mg daily with dinner  Please return in 3-4 months with your sugar log.   - today, HbA1c is 6.6% (Improved) - continue checking sugars at different times of the day - check 1x a day, rotating checks - advised for yearly eye exams >> she is UTD - Return to clinic in 3-4 mo with sugar log    2. HL - Reviewed latest lipid panel -lipids fractions were sent after stopping Zetia and Lovaza.  We restarted these at last visit. - no side effects from the above medicines.  3.  Overweight -Patient gained 10 pounds since last visit.  However, she is determined to start working on improving her diet (we discussed at length about this) and also starting to walk in the morning.  Carlus Pavlovristina Shiron Whetsel, MD PhD [facility]

## 2018-10-11 NOTE — Patient Instructions (Addendum)
Please continue: - Metformin ER 2000 mg daily with dinner  Please return in 3-4 months with your sugar log.

## 2018-11-03 ENCOUNTER — Encounter: Payer: Self-pay | Admitting: General Surgery

## 2018-11-12 ENCOUNTER — Other Ambulatory Visit: Payer: Self-pay

## 2018-11-12 ENCOUNTER — Ambulatory Visit (INDEPENDENT_AMBULATORY_CARE_PROVIDER_SITE_OTHER): Payer: 59 | Admitting: General Surgery

## 2018-11-12 ENCOUNTER — Encounter: Payer: Self-pay | Admitting: General Surgery

## 2018-11-12 VITALS — BP 114/76 | HR 71 | Resp 14 | Ht 63.0 in | Wt 160.0 lb

## 2018-11-12 DIAGNOSIS — R1011 Right upper quadrant pain: Secondary | ICD-10-CM | POA: Diagnosis not present

## 2018-11-12 NOTE — Progress Notes (Signed)
Patient ID: Kim Lowery, female   DOB: 1951/01/13, 67 y.o.   MRN: [id number]  Chief Complaint  Patient presents with  . Follow-up    HPI Kim Lowery is a 67 y.o. female.  Here for follow up gall bladder and discuss possible surgery. She states she has "colicy" burning pain that comes and goes.  The patient had been identified with gastritis on past upper endoscopies.  She finds that she is not experiencing postprandial pain, but does obtain relief of some epigastric discomfort with meals.  No weight loss.  No fever or chills.  Past history of pancreatitis, mild by both laboratory and imaging studies in summer 2019 well treated at [facility] in [place].    HPI  Past Medical History:  Diagnosis Date  . Allergy   . Diabetes mellitus without complication (HCC) 2009?   diet controlled  . Hx of pancreatitis 2019  . Hypertension     Past Surgical History:  Procedure Laterality Date  . ABDOMINAL HYSTERECTOMY  1996   Total  . BREAST BIOPSY Left 1972, 1999  . CESAREAN SECTION  1988  . COLONOSCOPY  2018  . HAND ARTHROPLASTY Bilateral 2014    Family History  Problem Relation Age of Onset  . Diabetes Mother   . Diabetes Father   . Gastric cancer Brother     Social History Social History   Tobacco Use  . Smoking status: Never Smoker  . Smokeless tobacco: Never Used  Substance Use Topics  . Alcohol use: Yes    Comment: wine occasionally  . Drug use: No    Allergies  Allergen Reactions  . Avelox [Moxifloxacin Hcl In Nacl] Nausea And Vomiting  . Other     Pro Air Inhalers-produces Stridor   . Macrobid [Nitrofurantoin Macrocrystal] Rash  . Sulfa Antibiotics Rash    Current Outpatient Medications  Medication Sig Dispense Refill  . aspirin EC 81 MG tablet Take 81 mg by mouth daily.    . Azelastine-Fluticasone (DYMISTA) 137-50 MCG/ACT SUSP Place into the nose.    . Continuous Blood Gluc Sensor (FREESTYLE LIBRE 14 DAY SENSOR) MISC 1 each by Does not  apply route every 14 (fourteen) days. Change every 2 weeks 2 each 11  . estradiol (CLIMARA) 0.1 mg/24hr patch Place 1 patch onto the skin once a week.    . Estradiol 10 MCG TABS vaginal tablet Place 1 tablet vaginally 2 (two) times a week.    . losartan-hydrochlorothiazide (HYZAAR) 50-12.5 MG tablet Take 1 tablet by mouth daily.    . metFORMIN (GLUCOPHAGE-XR) 500 MG 24 hr tablet Take 2 tablets (1,000 mg total) by mouth 2 (two) times daily with a meal. (Patient taking differently: Take 2,000 mg by mouth daily with breakfast. ) 360 tablet 4  . montelukast (SINGULAIR) 10 MG tablet Take 10 mg by mouth daily.    . Testosterone 10 MG/ACT (2%) GEL Place onto the skin.    Marland Kitchen TRIMETHOPRIM PO Take 100 mg by mouth.    . Vitamin D, Ergocalciferol, (DRISDOL) 50000 units CAPS capsule      No current facility-administered medications for this visit.     Review of Systems Review of Systems  Constitutional: Negative.   Respiratory: Negative.   Cardiovascular: Negative.   Gastrointestinal: Positive for constipation.    Blood pressure 114/76, pulse 71, resp. rate 14, height 5\' 3"  (1.6 m), weight 160 lb (72.6 kg), SpO2 98 %.  Physical Exam Physical Exam Eyes:     General: No scleral icterus.  Neurological:     Mental Status: She is alert and oriented to person, place, and time.     Data Reviewed Review of prior imaging studies and laboratory.  Assessment    The patient has no strong symptoms to suggest biliary colic and in light of a negative ultrasound and lack of reproduction of symptoms with HIDA scan with Ensure, I think cholecystectomy should be deferred at present.    Plan  Today's visit was confined to a review of her symptoms and previous imaging studies.  Return as needed.The patient is aware to call back for any questions or concerns.  HPI, Physical Exam, Assessment and Plan have been scribed under the direction and in the presence of Donnalee CurryJeffrey Rozina Pointer, MD.  Ples SpecterJessica Qualls,  CMA  HPI, Physical Exam, Assessment and Plan have been scribed under the direction and in the presence of Earline MayotteJeffrey W. Manda Holstad, MD. Dorathy DaftMarsha Hatch, RN  I have completed the exam and reviewed the above documentation for accuracy and completeness.  I agree with the above.  [employer] has been used and any errors in dictation or transcription are unintentional.  Donnalee CurryJeffrey Reford Olliff, M.D., F.A.C.S.  Merrily PewJeffrey W Kerin Kren 11/13/2018, 5:53 AM

## 2018-11-12 NOTE — Patient Instructions (Signed)
Return as needed.The patient is aware to call back for any questions or concerns.  

## 2018-11-14 ENCOUNTER — Encounter: Payer: Self-pay | Admitting: Internal Medicine

## 2018-11-26 ENCOUNTER — Ambulatory Visit: Payer: 59 | Admitting: General Surgery

## 2018-12-06 ENCOUNTER — Other Ambulatory Visit (HOSPITAL_COMMUNITY): Payer: Self-pay | Admitting: Internal Medicine

## 2018-12-06 ENCOUNTER — Other Ambulatory Visit: Payer: Self-pay | Admitting: Internal Medicine

## 2018-12-06 DIAGNOSIS — R1011 Right upper quadrant pain: Secondary | ICD-10-CM

## 2018-12-06 DIAGNOSIS — R1032 Left lower quadrant pain: Secondary | ICD-10-CM

## 2018-12-17 ENCOUNTER — Ambulatory Visit
Admission: RE | Admit: 2018-12-17 | Discharge: 2018-12-17 | Disposition: A | Discharge status: Home / Self Care | Payer: Managed Care, Other (non HMO) | Source: Ambulatory Visit | Attending: Internal Medicine | Admitting: Internal Medicine

## 2018-12-17 DIAGNOSIS — R1032 Left lower quadrant pain: Secondary | ICD-10-CM | POA: Diagnosis present

## 2018-12-17 DIAGNOSIS — R1011 Right upper quadrant pain: Secondary | ICD-10-CM | POA: Insufficient documentation

## 2018-12-23 ENCOUNTER — Encounter: Payer: Self-pay | Admitting: Internal Medicine

## 2018-12-24 NOTE — Telephone Encounter (Signed)
Dr. Elvera Lennox,  I personally have not received any calls or faxes. Judeth Cornfield did tell me if the front receives a fax request for records they automatically send it to medical records. I asked her to let the front know to always give them to me first. I will fax over the last notes.   This MyChart message was sent in the PCP own medical record regarding the patient.

## 2019-01-13 ENCOUNTER — Ambulatory Visit: Payer: 59 | Admitting: Internal Medicine

## 2019-02-21 ENCOUNTER — Ambulatory Visit: Payer: 59 | Admitting: Internal Medicine

## 2019-06-09 ENCOUNTER — Encounter: Payer: Self-pay | Admitting: Internal Medicine

## 2019-06-10 ENCOUNTER — Telehealth: Payer: Self-pay | Admitting: Nutrition

## 2019-06-10 NOTE — Telephone Encounter (Signed)
Kim Lowery reports that she is no longer on the Wilson sensor and has updated her records to show this.  She also reports that she has sent the blood sugar readings to Dr. Cruzita Lederer via my chart for her upcoming visit.

## 2019-06-10 NOTE — Telephone Encounter (Signed)
OK, Thank you! C

## 2019-06-11 ENCOUNTER — Encounter: Payer: Self-pay | Admitting: Internal Medicine

## 2019-06-11 ENCOUNTER — Other Ambulatory Visit: Payer: Self-pay

## 2019-06-11 ENCOUNTER — Ambulatory Visit (INDEPENDENT_AMBULATORY_CARE_PROVIDER_SITE_OTHER): Payer: Managed Care, Other (non HMO) | Admitting: Internal Medicine

## 2019-06-11 DIAGNOSIS — E663 Overweight: Secondary | ICD-10-CM

## 2019-06-11 DIAGNOSIS — E785 Hyperlipidemia, unspecified: Secondary | ICD-10-CM

## 2019-06-11 DIAGNOSIS — E1165 Type 2 diabetes mellitus with hyperglycemia: Secondary | ICD-10-CM

## 2019-06-11 MED ORDER — LIVALO 4 MG PO TABS: ORAL_TABLET | ORAL | 11 refills | Status: DC

## 2019-06-11 MED ORDER — GLIPIZIDE 5 MG PO TABS: 2.5000 mg | ORAL_TABLET | Freq: Every day | ORAL | 3 refills | Status: DC

## 2019-06-11 NOTE — Patient Instructions (Signed)
Please continue: - Metformin ER 2000 mg daily with dinner  Please: - Glipizide 2.5-5 mg before dinner  Please return in 3-4 months with your sugar log.

## 2019-06-11 NOTE — Progress Notes (Signed)
Patient ID: Kim Lowery, female   DOB: 05-19-1951, 68 y.o.   MRN: [id number]   Patient location: Home My location: Office  I connected with the patient on 06/11/19 at  8:49 AM EDT by a video enabled telemedicine application and verified that I am speaking with the correct person.   I discussed the limitations of evaluation and management by telemedicine and the availability of in person appointments. The patient expressed understanding and agreed to proceed.   Details of the encounter are shown below.  HPI: Kim RiggsSylvia Slay is a 68 y.o.-year-old female, initially referred by her gastroenterologist, Dr. Loreta AveMann, presenting for follow-up for DM2, dx ~2009, non-insulin-dependent, fairly well controlled, without long-term complications.  Last visit 8 months ago.  She contacted me recently that her sugars are higher.  She had tachycardia >> stopped Hyzaar at that time but since restarted Cozaar 100 and HCTZ 25.  Last hemoglobin A1c: 02/2019: HbA1c 7.4% 06/12/2018 : HbA1c 7.1% 12/21/2017: HbA1c 6.9% 06/08/2017: HbA1c 6.6% Lab Results  Component Value Date   HGBA1C 6.7 (A) 10/11/2018    She is on: - Metformin ER 1000 >> 2000 mg at bedtime  She has a history of pancreatitis on 04/11/2018, possibly due to Januvia. She stopped Januvia at that time and also stopped Glucophage DAW 2000 mg daily and  Ezetimibe and Lovaza then.  We restarted these afterwards.  She checks her sugars 1-2x a day: - am: 114-135 >> 114-140, 152 (sweet potato pie) >> 90, 121-126,156 - 2h after b'fast:  - before lunch: n/c - 2h after lunch: n/c >> 132-179 - before dinner: n/c - 2h after dinner: 140-160 >> 175, 187, 210 >> 162-200, 218 - bedtime: n/c - nighttime: n/c  Lowest sugar was 70s (Libre) >> 114 >> 90s, she has hypoglycemia awareness in the 2070s.  Highest sugar was 200 (2h after dinner - cake) >> 210 >> 218.  Glucometer:Freestyle Libre CGM >> now Freestyle (Precision strips).  Pt's meals are: - Breakfast:  Oatmeal, fruit - Lunch: Vegan soup, chili - Dinner: Vegetable stirfry/rice, dahl or mujadara - Snacks: - Before her pancreatitis episode, she was walking 2 to 4 miles 3 times a week and lifting weights twice a week.  She restarted exercise afterwards.  -No CKD, last BUN/creatinine:  08/30/2018: 13/0.9, GFR 76, glucose 131 AST/ALT high, at 43/61 12/21/2017: 9/0.8, glucose 129, ALT slightly high, at 42 Lab Results  Component Value Date   BUN 15 08/13/2017   BUN 10 08/05/2013   CREATININE 0.83 08/13/2017   CREATININE 0.75 08/05/2013  On Losartan 50.  -+ HL; last set of lipids: 06/07/2018: 179/210/46/91 12/15/2017: 139/120/44/71 No results found for: CHOL, HDL, LDLCALC, LDLDIRECT, TRIG, CHOLHDL  On Zetia, Lovaza.  - last eye exam was in 07/2018 [facility]([place] -reviewed Dr. Rexford Mausadionchenko's note): No DR, + cataract  - no numbness and tingling in her feet.  She is on ASA 81.  Pt has FH of DM in mother, father, brother.  She has a h/o vit D def. 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  ROS: Constitutional: + weight gain/no weight loss, no fatigue, no subjective hyperthermia, no subjective hypothermia Eyes: no blurry vision, no xerophthalmia ENT: no sore throat, no nodules palpated in neck, no dysphagia, no odynophagia, no hoarseness Cardiovascular: no CP/no SOB/no palpitations/no leg swelling Respiratory: no cough/no SOB/no wheezing Gastrointestinal: no  N/no V/no D/no C/no acid reflux Musculoskeletal: no muscle aches/no joint aches Skin: no rashes, no hair loss Neurological: no tremors/no numbness/no tingling/no dizziness  I reviewed pt's medications, allergies, PMH, social hx, family hx, and changes were documented in the history of present illness. Otherwise, unchanged from my initial visit note.  Past Medical History:  Diagnosis Date  . Allergy   . Diabetes  mellitus without complication (HCC) 2009?   diet controlled  . Hx of pancreatitis 2019  . Hypertension    Past Surgical History:  Procedure Laterality Date  . ABDOMINAL HYSTERECTOMY  1996   Total  . BREAST BIOPSY Left 1972, 1999  . CESAREAN SECTION  1988  . COLONOSCOPY  2018  . HAND ARTHROPLASTY Bilateral 2014   Social History   Socioeconomic History  . Marital status: Married    Spouse name: Not on file  . Number of children: 4  . Years of education: Not on file  . Highest education level: Not on file  Occupational History  . NP  Tobacco Use  . Smoking status: Never Smoker  . Smokeless tobacco: Never Used  Substance and Sexual Activity  . Alcohol use: Yes    Comment: wine occasionally, 1 drink  . Drug use: No   Current Outpatient Medications  Medication Sig Dispense Refill  . aspirin EC 81 MG tablet Take 81 mg by mouth daily.    . Azelastine-Fluticasone (DYMISTA) 137-50 MCG/ACT SUSP Place into the nose.    . Continuous Blood Gluc Sensor (FREESTYLE LIBRE 14 DAY SENSOR) MISC 1 each by Does not apply route every 14 (fourteen) days. Change every 2 weeks 2 each 11  . estradiol (CLIMARA) 0.1 mg/24hr patch Place 1 patch onto the skin once a week.    . losartan-hydrochlorothiazide (HYZAAR) 50-12.5 MG tablet Take 1 tablet by mouth daily.    . metFORMIN (GLUCOPHAGE-XR) 500 MG 24 hr tablet Take 2 tablets (1,000 mg total) by mouth 2 (two) times daily with a meal. (Patient taking differently: Take 2,000 mg by mouth daily with breakfast. ) 360 tablet 4  . montelukast (SINGULAIR) 10 MG tablet Take 10 mg by mouth daily.    . Testosterone 10 MG/ACT (2%) GEL Place onto the skin.    Marland Kitchen. TRIMETHOPRIM PO Take 100 mg by mouth.    . Vitamin D, Ergocalciferol, (DRISDOL) 50000 units CAPS capsule      No current facility-administered medications for this visit.     Allergies  Allergen Reactions  . Avelox [Moxifloxacin Hcl In Nacl] Nausea And Vomiting  . Other     Pro Air Inhalers-produces  Stridor   . Macrobid [Nitrofurantoin Macrocrystal] Rash  . Sulfa Antibiotics Rash   Family History  Problem Relation Age of Onset  . Diabetes Mother   . Diabetes Father   . Gastric cancer Brother    PE: There were no vitals taken for this visit. Wt Readings from Last 3 Encounters:  11/12/18 160 lb (72.6 kg)  10/11/18 162 lb (73.5 kg)  06/14/18 152 lb 12.8 oz (69.3 kg)   Constitutional:  in NAD  The physical exam was not performed (virtual visit).   ASSESSMENT: 1. DM2, non-insulin-dependent, fairly well controlled, without long-term complications, but with hyperglycemia  2. HL  3.  Overweight  PLAN:  1. Patient with longstanding, on metformin ER now, with higher blood sugars during the coronavirus pandemic.  She was on Januvia before but she developed pancreatitis 03/2018 so we cannot use DPP 4 inhibitors or GLP-1 receptor agonist for  her.  She refused sulfonylureas at last visit. -She had an HbA1c obtained 2 months ago and this was higher, at 7.4% -She continues on the mostly plant-based diet but she mentions that she reintroduced meat and has some other dietary indiscretions also.  Sugars are higher usually after dinner. -At this visit, she tells me that she is worried about occasional 200 CBGs after dinner but these are not happening quite after every meal.  At this point, discussed that we have several options besides an improvement in her diet, which she is ready to:  Adding a low-dose glipizide before larger meals  Adding Cycloset-discussed mechanism of action and dosing  Adding a low-dose SGLT 2 inhibitor  Adding insulin (she is wondering about Fiasp) before dinner - We discussed about all the above options and decided to go with a low-dose glipizide before dinner - I suggested to:  Patient Instructions  Please continue: - Metformin ER 2000 mg daily with dinner  Please: - Glipizide 2.5-5 mg before dinner  Please return in 3-4 months with your sugar log.   -  we will recheck an HbA1c when she returns to the clinic - advised to check sugars at different times of the day - 1x a day, rotating check times - advised for yearly eye exams >> she is UTD - return to clinic in 4 months   2. HL - Reviewed latest lipid panel  - from 05/2018 >> TG high - Continues the Zetia and Lovaza without side effects.  Also, PCP added Livalo 4 mg, which he tolerates well. -She has another lipid panel pending  3.  Overweight - at last OV >>  she gained 10 lbs from the previous visit >> started to work on diet - now gained 5 lbs since last OV as she relaxed her diet since last visit but is determined to eliminate meat again  Philemon Kingdom, MD PhD [facility] Endocrinology

## 2019-06-28 ENCOUNTER — Encounter: Payer: Self-pay | Admitting: Internal Medicine

## 2019-06-30 MED ORDER — GLIPIZIDE 5 MG PO TABS: 2.5000 mg | ORAL_TABLET | Freq: Two times a day (BID) | ORAL | 3 refills | Status: DC

## 2019-06-30 NOTE — Telephone Encounter (Signed)
Please review pt concern and advise. Please let me know if there is anything you would like me to do to assist

## 2019-08-08 ENCOUNTER — Encounter: Payer: Self-pay | Admitting: Internal Medicine

## 2019-08-11 ENCOUNTER — Other Ambulatory Visit: Payer: Self-pay

## 2019-08-11 DIAGNOSIS — E1165 Type 2 diabetes mellitus with hyperglycemia: Secondary | ICD-10-CM

## 2019-08-11 MED ORDER — FREESTYLE LIBRE 14 DAY SENSOR MISC: 1.0000 | 2 refills | Status: DC

## 2019-09-23 ENCOUNTER — Ambulatory Visit (INDEPENDENT_AMBULATORY_CARE_PROVIDER_SITE_OTHER): Payer: Managed Care, Other (non HMO) | Admitting: Internal Medicine

## 2019-09-23 ENCOUNTER — Encounter: Payer: Self-pay | Admitting: Internal Medicine

## 2019-09-23 ENCOUNTER — Other Ambulatory Visit: Payer: Self-pay

## 2019-09-23 VITALS — BP 138/70 | HR 61 | Ht 63.0 in | Wt 147.0 lb

## 2019-09-23 DIAGNOSIS — E785 Hyperlipidemia, unspecified: Secondary | ICD-10-CM | POA: Diagnosis not present

## 2019-09-23 DIAGNOSIS — E663 Overweight: Secondary | ICD-10-CM

## 2019-09-23 DIAGNOSIS — E1165 Type 2 diabetes mellitus with hyperglycemia: Secondary | ICD-10-CM

## 2019-09-23 LAB — POCT GLYCOSYLATED HEMOGLOBIN (HGB A1C): Hemoglobin A1C: 5.9 % — AB (ref 4.0–5.6)

## 2019-09-23 MED ORDER — LOSARTAN POTASSIUM 100 MG PO TABS: 100.0000 mg | ORAL_TABLET | Freq: Every day | ORAL | 3 refills | Status: DC

## 2019-09-23 NOTE — Progress Notes (Signed)
Patient ID: Kim Lowery, female   DOB: 04/07/1951, 68 y.o.   MRN: [id number]   HPI: Kim Lowery is a 68 y.o.-year-old female, initially referred by her gastroenterologist, Dr. Loreta AveMann, presenting for follow-up for DM2, dx ~2009, non-insulin-dependent, fairly well controlled, without long-term complications.  Last visit 3.5 months ago (virtual).  Since last visit, she participated in [employer]), she adopted a whole food, no oil, plant-based diet and also started exercise (walking 4 to 7 miles daily with her husband).  Her sugars improved significantly and she was able to reduce the metformin dose and then even stop this completely afterwards.  She is now worried that her freestyle libre CGM is showing her low blood sugars.  Last hemoglobin A1c: 08/21/2019: HbA1c 6.8% 05/02/2019: HbA1c 7.4% 02/2019: HbA1c 7.4% 06/12/2018 : HbA1c 7.1% 12/21/2017: HbA1c 6.9% 06/08/2017: HbA1c 6.6% Lab Results  Component Value Date   HGBA1C 6.7 (A) 10/11/2018    She was previously on: - Metformin ER 1000 >> 2000 mg at bedtime >> 1000 mg with dinner >> stopped  She has a history of pancreatitis on 04/11/2018, possibly due to Januvia. She stopped Januvia at that time and also stopped Glucophage DAW 2000 mg daily and  Ezetimibe and Lovaza then.  We restarted these afterwards, but she stopped them recently.  Pt.checks her sugars more than 4 times a day with her CGM.  Freestyle libre CGM parameters: - Average: 80 - % active CGM time: 92 of the time - Glucose variability 24.9% (target < or = to 36%) - time in range:  - very low (<54): 2% - low (54-69): 31% - normal range (70-180): 67% - high sugars (181-250): 0% - very high sugars (>250): 0%    Previously: - am: 114-135 >> 114-140, 152 (sweet potato pie) >> 90, 121-126,156 - 2h after b'fast:  - before lunch: n/c - 2h after lunch: n/c >> 132-179 - before dinner: n/c - 2h after dinner: 140-160 >> 175, 187, 210 >> 162-200, 218 -  bedtime: n/c - nighttime: n/c  Lowest sugar was 70s (Libre) >> 114 >> 90s >> 3449 (Libre), she has hypoglycemia awareness in the 1470s.  Highest sugar was 200 (2h after dinner - cake) >> 210 >> 218 >> 164.  Glucometer:Freestyle Libre CGM  -No CKD, last BUN/creatinine:  07/22/2019: Glucose 112, 7/0.9, GFR 76 05/02/2019: Glucose 135, 12/0.9, GFR 76 08/30/2018: 13/0.9, GFR 76, glucose 131 AST/ALT high, at 43/61 12/21/2017: 9/0.8, glucose 129, ALT slightly high, at 42 Lab Results  Component Value Date   BUN 15 08/13/2017   BUN 10 08/05/2013   CREATININE 0.83 08/13/2017   CREATININE 0.75 08/05/2013  On losartan 100.  -+ HL; last set of lipids: 07/22/2019: 117/94/30 6.4/37 05/02/2019: 168/116/33/95 06/07/2018: 179/210/46/91 12/15/2017: 139/120/44/71 No results found for: CHOL, HDL, LDLCALC, LDLDIRECT, TRIG, CHOLHDL  On Zetia, Lovaza, evaluate for.  - last eye exam was in 07/2019 [facility]([place] [place] -reviewed Dr. Rexford Mausadionchenko's note): no DR, + cataract  -No numbness and tingling in her feet.  She is on ASA 81.  Pt has FH of DM in mother, father, brother.  She has a h/o vit D def. 08/21/2019: Vitamin D 30.7 No results found for: VD25OH   Latest TSH was normal: 08/30/2018: TSH 2.277 No results found for: TSH    Vitamin B12: 08/21/2019: 883.  She was a nurse practitioner-retired recently.  ROS: Constitutional: no weight gain/+ weight loss, no fatigue, no subjective hyperthermia, no subjective hypothermia Eyes: no blurry vision, no xerophthalmia ENT: no  sore throat, no nodules palpated in neck, no dysphagia, no odynophagia, no hoarseness Cardiovascular: no CP/no SOB/no palpitations/no leg swelling Respiratory: no cough/no SOB/no wheezing Gastrointestinal: no N/no V/no D/no C/no acid reflux Musculoskeletal: no muscle aches/no joint aches Skin: no rashes, no hair loss Neurological: no tremors/no numbness/no tingling/no dizziness  I reviewed pt's medications, allergies, PMH, social hx, family  hx, and changes were documented in the history of present illness. Otherwise, unchanged from my initial visit note.  Past Medical History:  Diagnosis Date   Allergy    Diabetes mellitus without complication ([place]) 5638?   diet controlled   Hx of pancreatitis 2019   Hypertension    Past Surgical History:  Procedure Laterality Date   ABDOMINAL HYSTERECTOMY  1996   Total   BREAST BIOPSY Left 1972, [place]   COLONOSCOPY  2018   HAND ARTHROPLASTY Bilateral 2014   Social History   Socioeconomic History   Marital status: Married    Spouse name: Not on file   Number of children: 4   Years of education: Not on file   Highest education level: Not on file  Occupational History   NP  Tobacco Use   Smoking status: Never Smoker   Smokeless tobacco: Never Used  Substance and Sexual Activity   Alcohol use: Yes    Comment: wine occasionally, 1 drink   Drug use: No   Current Outpatient Medications  Medication Sig Dispense Refill   aspirin EC 81 MG tablet Take 81 mg by mouth daily.     Continuous Blood Gluc Sensor (FREESTYLE LIBRE 14 DAY SENSOR) MISC 1 each by Does not apply route every 14 (fourteen) days. Use with CGM system. Change every 14 days 6 each 2   estradiol (CLIMARA) 0.1 mg/24hr patch Place 1 patch onto the skin once a week.     glipiZIDE (GLUCOTROL) 5 MG tablet Take 0.5 tablets (2.5 mg total) by mouth 2 (two) times a day. 90 tablet 3   metFORMIN (GLUCOPHAGE-XR) 500 MG 24 hr tablet Take 2 tablets (1,000 mg total) by mouth 2 (two) times daily with a meal. (Patient taking differently: Take 2,000 mg by mouth daily with breakfast. ) 360 tablet 4   Pitavastatin Calcium (LIVALO) 4 MG TABS Take 1 tablet daily 30 tablet 11   Testosterone 10 MG/ACT (2%) GEL Place onto the skin.     TRIMETHOPRIM PO Take 100 mg by mouth.     Vitamin D, Ergocalciferol, (DRISDOL) 50000 units CAPS capsule      No current facility-administered medications for  this visit.     Allergies  Allergen Reactions   Avelox [Moxifloxacin Hcl In Nacl] Nausea And Vomiting   Other     Pro Air Inhalers-produces Stridor    Macrobid [Nitrofurantoin Macrocrystal] Rash   Sulfa Antibiotics Rash   Family History  Problem Relation Age of Onset   Diabetes Mother    Diabetes Father    Gastric cancer Brother    PE: BP 138/70    Pulse 61    Ht 5\' 3"  (1.6 m)    Wt 147 lb (66.7 kg)    SpO2 98%    BMI 26.04 kg/m  Wt Readings from Last 3 Encounters:  09/23/19 147 lb (66.7 kg)  11/12/18 160 lb (72.6 kg)  10/11/18 162 lb (73.5 kg)   Constitutional: normal weight, in NAD Eyes: PERRLA, EOMI, no exophthalmos ENT: moist mucous membranes, no thyromegaly, no cervical lymphadenopathy Cardiovascular: RRR, No MRG Respiratory: CTA B  Gastrointestinal: abdomen soft, NT, ND, BS+ Musculoskeletal: no deformities, strength intact in all 4 Skin: moist, warm, no rashes Neurological: no tremor with outstretched hands, DTR normal in all 4  ASSESSMENT: 1. DM2, non-insulin-dependent, fairly well controlled, without long-term complications, but with hyperglycemia  2. HL  3.  Overweight  PLAN:  1. Patient with longstanding, uncontrolled, type 2 diabetes, on Metformin ER only, with higher blood sugars initially during the coronavirus pandemic.  She was on Januvia before but she developed pancreatitis 03/2018 so we cannot use a DPP 4 inhibitor or GLP-1 receptor agonist for her.  HbA1c from 05/02/2019 was 7.4%, stable.  However, she had a more recent HbA1c from 08/21/2019: 6.8%, improved. -At last visit, she followed mostly plant-based diet but she reached his needs and had dietary indiscretions also.  Since then, she started to improve her diet to a whole food no oil plant-based diet and also introduced intermittent fasting.  She is also active, walking several miles a day and more during the weekend (7 miles).  Sugars improved significantly and she is now worried about low blood  sugars.  She stopped metformin recently.  We discussed that the low blood sugars checked by her libre CGM are usually 20 to 30 mg/dL lower than her sugars checked by a glucometer.  She confirms that she did check them with a glucometer and they were usually higher.  She tells me that she only sometimes feels some of the low blood sugars, but inconsistently.  She is trying to get approved snack before her walk, which I advised her to continue.  For now, I also advised her that she can take off her CGM.  I advised her to keep it as we we may need it in the future, but I do not feel this is absolutely necessary for now.  I congratulated her for her achievement and will have her back in 6 months to repeat her HbA1c. - I suggested to:  Patient Instructions  Please continue off diabetes medicines.  You can take the [place] off.  Please return in 6 months.  - we checked her HbA1c: 5.9% (better)  - advised to check sugars at different times of the day - 1x a day, rotating check times - advised for yearly eye exams >> she is UTD - return to clinic in 6 months   2. HL - Reviewed latest lipid panel from 04/2019: LDL and triglycerides at goal - Stopped Zetia and Lovaza.  PCP added Livalo 4 mg daily before last visit and she is tolerating this well.  She is continuing this.  3.  Overweight -She started to improve her diet and do intermittent fasting and increased exercise since last visit >> she lost approximately 15 pounds by her scale at home.  Her husband is also doing the same diet with her and he lost 20 pounds. -I strongly advised her to continue the diet, she is doing excellent!  Carlus Pavlov, MD PhD [facility] Endocrinology

## 2019-09-23 NOTE — Patient Instructions (Addendum)
Please stay off diabetes medications.  Stop the [place].  Please return in 6 months with your sugar.

## 2019-09-23 NOTE — Addendum Note (Signed)
Addended by: Cardell Peach I on: 09/23/2019 12:46 PM   Modules accepted: Orders

## 2020-03-24 ENCOUNTER — Other Ambulatory Visit: Payer: Self-pay

## 2020-03-25 ENCOUNTER — Encounter: Payer: Self-pay | Admitting: Internal Medicine

## 2020-03-25 ENCOUNTER — Ambulatory Visit (INDEPENDENT_AMBULATORY_CARE_PROVIDER_SITE_OTHER): Payer: Managed Care, Other (non HMO) | Admitting: Internal Medicine

## 2020-03-25 VITALS — BP 122/80 | HR 54 | Ht 63.0 in | Wt 138.0 lb

## 2020-03-25 DIAGNOSIS — E1165 Type 2 diabetes mellitus with hyperglycemia: Secondary | ICD-10-CM | POA: Diagnosis not present

## 2020-03-25 DIAGNOSIS — E785 Hyperlipidemia, unspecified: Secondary | ICD-10-CM | POA: Diagnosis not present

## 2020-03-25 DIAGNOSIS — E663 Overweight: Secondary | ICD-10-CM

## 2020-03-25 LAB — POCT GLYCOSYLATED HEMOGLOBIN (HGB A1C): Hemoglobin A1C: 5.9 % — AB (ref 4.0–5.6)

## 2020-03-25 NOTE — Progress Notes (Signed)
Patient ID: Kim Lowery, female   DOB: [birth year], 69 y.o.   MRN: [id number]   This visit occurred during the SARS-CoV-2 public health emergency.  Safety protocols were in place, including screening questions prior to the visit, additional usage of staff PPE, and extensive cleaning of exam room while observing appropriate contact time as indicated for disinfecting solutions.   HPI: Kim Lowery is a 69 y.o.-year-old female, initially referred by her gastroenterologist, Dr. Loreta Ave, presenting for follow-up for DM2, dx ~2009, non-insulin-dependent, fairly well controlled, without long-term complications.  Last visit 3.5 months ago (virtual).  Reviewed history: Before last visit, she participated in [employer] she adopted a whole food, no oil, plant-based diet and also started exercise (walking 4 to 7 miles daily with her husband).  Her sugars improved significantly and she was able to reduce Metformin and even stop this completely afterwards.  Since her sugars improved so significantly, we were able to stop the CGM at last visit.  Reviewed HbA1c levels: Lab Results  Component Value Date   HGBA1C 5.9 (A) 09/23/2019   HGBA1C 6.7 (A) 10/11/2018  08/21/2019: HbA1c 6.8% 05/02/2019: HbA1c 7.4% 02/2019: HbA1c 7.4% 06/12/2018 : HbA1c 7.1% 12/21/2017: HbA1c 6.9% 06/08/2017: HbA1c 6.6%  She was previously on: - Metformin ER 1000 >> 2000 mg at bedtime >> 1000 mg with dinner >> stopped  She has a history of pancreatitis on 04/11/2018, possibly due to Januvia. She stopped Januvia at that time and also stopped Glucophage DAW 2000 mg daily and  Ezetimibe and Lovaza then.  We restarted these afterwards, but she stopped them recently.  Now checking once a week with her glucometer: - am: 90's, 121 - 2h after b'fast: n/c - lunch: n/c - 2h after lunch: 90-120 - dinner: n/c - 2h after dinner: n/c - bedtime: 90s  Previously:   Lowest sugar was 70s (Libre) >> 114 >> 90s >> 49  (Libre) >> 85, she has hypoglycemia awareness in the 71s.  Highest sugar was 200 (2h after dinner - cake) >> 210 >> 218 >> 164 >> 121.  -No CKD, last BUN/creatinine:  07/22/2019: Glucose 112, 7/0.9, GFR 76 05/02/2019: Glucose 135, 12/0.9, GFR 76 08/30/2018: 13/0.9, GFR 76, glucose 131 AST/ALT high, at 43/61 12/21/2017: 9/0.8, glucose 129, ALT slightly high, at 42 Lab Results  Component Value Date   BUN 15 08/13/2017   BUN 10 08/05/2013   CREATININE 0.83 08/13/2017   CREATININE 0.75 08/05/2013  Previously on losartan 50, now off.  -+ HL; last set of lipids: 11/24/2019: 173/139/34.4/93 07/22/2019: 117/94/36.4/37 05/02/2019: 168/116/33/95 06/07/2018: 179/210/46/91 12/15/2017: 139/120/44/71 No results found for: CHOL, HDL, LDLCALC, LDLDIRECT, TRIG, CHOLHDL  Previously on Zetia and Lovaza, then on Livalo, now off.  - last eye exam was in 07/2019: No DR + cataracts [facility] -reviewed Dr. Rexford Maus note)  -No numbness and tingling in her feet.  On ASA 81.  Pt has FH of DM in mother, father, brother.  She has a history of vitamin D deficiency: 08/21/2019: Vitamin D 30.7 No results found for: VD25OH   Latest TSH was normal: 08/30/2018: TSH 2.277 No results found for: TSH    Vitamin B12: 08/21/2019: 883.  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  ROS: Constitutional: no weight gain/no weight loss, no fatigue, no subjective hyperthermia, no subjective hypothermia Eyes: no blurry vision, no xerophthalmia ENT: no sore throat, no nodules palpated in neck, no dysphagia, no odynophagia, no hoarseness Cardiovascular: no CP/no SOB/no palpitations/no leg swelling Respiratory: no cough/no  SOB/no wheezing Gastrointestinal: no N/no V/no D/no C/no acid reflux Musculoskeletal: no muscle aches/no joint aches Skin: no rashes, no hair loss Neurological: no tremors/no numbness/no tingling/no dizziness  I reviewed pt's medications, allergies, PMH, social hx, family hx, and  changes were documented in the history of present illness. Otherwise, unchanged from my initial visit note.  Past Medical History:  Diagnosis Date  . Allergy   . Diabetes mellitus without complication ([place]) 8657?   diet controlled  . Hx of pancreatitis 2019  . Hypertension    Past Surgical History:  Procedure Laterality Date  . ABDOMINAL HYSTERECTOMY  1996   Total  . BREAST BIOPSY Left 1972, 1999  . CESAREAN SECTION  1988  . COLONOSCOPY  2018  . HAND ARTHROPLASTY Bilateral 2014   Social History   Socioeconomic History  . Marital status: Married    Spouse name: Not on file  . Number of children: 4  . Years of education: Not on file  . Highest education level: Not on file  Occupational History  . NP  Tobacco Use  . Smoking status: Never Smoker  . Smokeless tobacco: Never Used  Substance and Sexual Activity  . Alcohol use: Yes    Comment: wine occasionally, 1 drink  . Drug use: No   Current Outpatient Medications  Medication Sig Dispense Refill  . aspirin EC 81 MG tablet Take 81 mg by mouth daily.    . Continuous Blood Gluc Sensor (FREESTYLE LIBRE 14 DAY SENSOR) MISC 1 each by Does not apply route every 14 (fourteen) days. Use with CGM system. Change every 14 days 6 each 2  . estradiol (CLIMARA) 0.1 mg/24hr patch Place 1 patch onto the skin once a week.    Marland Kitchen glipiZIDE (GLUCOTROL) 5 MG tablet Take 0.5 tablets (2.5 mg total) by mouth 2 (two) times a day. (Patient not taking: Reported on 09/23/2019) 90 tablet 3  . losartan (COZAAR) 100 MG tablet Take 1 tablet (100 mg total) by mouth daily. 90 tablet 3  . Pitavastatin Calcium (LIVALO) 4 MG TABS Take 1 tablet daily 30 tablet 11  . Testosterone 10 MG/ACT (2%) GEL Place onto the skin.    . Vitamin D, Ergocalciferol, (DRISDOL) 50000 units CAPS capsule      No current facility-administered medications for this visit.    Allergies  Allergen Reactions  . Avelox [Moxifloxacin Hcl In Nacl] Nausea And Vomiting  . Other     Pro  Air Inhalers-produces Stridor   . Macrobid [Nitrofurantoin Macrocrystal] Rash  . Sulfa Antibiotics Rash   Family History  Problem Relation Age of Onset  . Diabetes Mother   . Diabetes Father   . Gastric cancer Brother    PE: BP 122/80   Pulse (!) 54   Ht 5\' 3"  (1.6 m)   Wt 138 lb (62.6 kg)   SpO2 99%   BMI 24.45 kg/m  Wt Readings from Last 3 Encounters:  03/25/20 138 lb (62.6 kg)  09/23/19 147 lb (66.7 kg)  11/12/18 160 lb (72.6 kg)   Constitutional: normal weight, in NAD Eyes: PERRLA, EOMI, no exophthalmos ENT: moist mucous membranes, no thyromegaly, no cervical lymphadenopathy Cardiovascular: RRR, No MRG Respiratory: CTA B Gastrointestinal: abdomen soft, NT, ND, BS+ Musculoskeletal: no deformities, strength intact in all 4 Skin: moist, warm, no rashes Neurological: no tremor with outstretched hands, DTR normal in all 4  ASSESSMENT: 1. DM2, non-insulin-dependent, fairly well controlled, without long-term complications, but with hyperglycemia  2. HL  3.  H/o Overweight  PLAN:  1. Patient with longstanding, previously uncontrolled type 2 diabetes, off antidiabetic medications due to improved control in the last year. Of note, she was also on glipizide in the past and Januvia.  She developed pancreatitis in 03/2018, presumably from [place]. -Last year, she switched to hold food, low oil plant-based diet and her sugars improved significantly.  She is also staying active walking several miles a day and more during the weekend.  She lost a significant amount of weight. -We initially had her on freestyle libre CGM but due to improvement in control and the fact that her CGM was giving her a lot of low blood sugars, we stopped this. -Latest HbA1c was excellent, at 5.9% -At this visit, her sugars are still excellent even though she is not checking them as often as before. -She continues on the plant-based diet and continues to walk and hike - she feels well, without  complaints. -From now on, she can continue to follow with PCP - I suggested to:  Patient Instructions  Please continue off diabetes medicines.  Please return to see me as needed.  - we checked her HbA1c: 5.9% (stable, excellent) - advised to check sugars at different times of the day - 1x a day, rotating check times - advised for yearly eye exams >> she is UTD - return to clinic as needed  2. HL -Reviewed latest lipid panel from 04/2019: LDL and triglycerides at goal, also reviewed her lipid NMR from 10/2019 -Previously on Zetia and Lovaza, which were then stopped and she was started on Livalo 4 mg daily added by PCP.  She is off this now  3.  H/o Overweight -She started to improve her diet and do intermittent fasting and increased exercise before last visit.  She lost approximately 15 pounds! -Since last visit, she lost 9 more lbs! >>  BMI now in the normal range  Carlus Pavlov, MD PhD [facility]

## 2020-03-25 NOTE — Patient Instructions (Addendum)
Please continue off diabetes medicines.  Please return to see me as needed.

## 2020-03-26 ENCOUNTER — Other Ambulatory Visit: Payer: Self-pay | Admitting: Surgery

## 2020-03-26 DIAGNOSIS — M7582 Other shoulder lesions, left shoulder: Secondary | ICD-10-CM

## 2020-03-26 DIAGNOSIS — S43432S Superior glenoid labrum lesion of left shoulder, sequela: Secondary | ICD-10-CM

## 2020-04-08 ENCOUNTER — Ambulatory Visit
Admission: RE | Admit: 2020-04-08 | Discharge: 2020-04-08 | Disposition: A | Discharge status: Home / Self Care | Payer: Managed Care, Other (non HMO) | Source: Ambulatory Visit | Attending: Surgery | Admitting: Surgery

## 2020-04-08 ENCOUNTER — Other Ambulatory Visit: Payer: Self-pay

## 2020-04-08 DIAGNOSIS — S43432S Superior glenoid labrum lesion of left shoulder, sequela: Secondary | ICD-10-CM | POA: Diagnosis present

## 2020-04-08 DIAGNOSIS — M7582 Other shoulder lesions, left shoulder: Secondary | ICD-10-CM

## 2020-04-17 ENCOUNTER — Emergency Department: Admit: 2020-04-18 | Payer: PRIVATE HEALTH INSURANCE

## 2020-04-17 DIAGNOSIS — R1013 Epigastric pain: Secondary | ICD-10-CM

## 2020-04-17 NOTE — ED Provider Notes (Signed)
[facility] ED  EMERGENCY DEPARTMENT ENCOUNTER      Pt Name: Diane Mccall  MRN: [id number]  Birthdate [birth year]  Date of evaluation: 04/17/2020  Provider: Rutherford Guys, DO        HISTORY OF PRESENT ILLNESS    Diane Mccall is a 69 y.o. female per chart review has ah/o DMII, gastritis, hypertension, pancreatitis, low vit d.    She presents with LUQ pain.  She is worried about if she has a flare of her pancreatitis.  She is visiting from [facility].  The history is provided by the patient.   Abdominal Pain  Pain location:  Epigastric  Pain quality: aching    Pain radiates to:  Epigastric region and back  Pain severity:  Moderate  Onset quality:  Gradual  Duration:  3 days  Timing:  Constant  Progression:  Worsening  Chronicity:  Chronic  Relieved by:  Nothing  Worsened by:  Palpation and movement  Ineffective treatments:  Antacids  Associated symptoms: no chest pain, no chills, no cough, no dysuria, no fever, no nausea, no shortness of breath, no sore throat and no vomiting             REVIEW OF SYSTEMS       Review of Systems   Constitutional: Negative for chills and fever.   HENT: Negative for ear pain and sore throat.    Eyes: Negative for discharge and redness.   Respiratory: Negative for cough and shortness of breath.    Cardiovascular: Negative for chest pain and palpitations.   Gastrointestinal: Positive for abdominal pain. Negative for nausea and vomiting.   Genitourinary: Negative for difficulty urinating and dysuria.   Musculoskeletal: Negative for back pain and neck pain.   Skin: Negative for rash and wound.   Neurological: Negative for dizziness and syncope.   Psychiatric/Behavioral: Negative for confusion. The patient is not nervous/anxious.    All other systems reviewed and are negative.      Except as noted above the remainder of the review of systems was reviewed and negative.       PAST MEDICAL HISTORY     Past Medical History:   Diagnosis Date   ??? Diabetes mellitus (HCC)     Diet controlled   ??? Gastritis     ??? Hypertension    ??? Low vitamin D level    ??? Pancreatitis          SURGICAL HISTORY       Past Surgical History:   Procedure Laterality Date   ??? ARTHROPLASTY Bilateral     Hands   ??? HYSTERECTOMY  1996         CURRENT MEDICATIONS       Discharge Medication List as of 04/18/2020 12:18 AM          ALLERGIES     Moxifloxacin, Nitrofurantoin, and Sulfamethoxazole    FAMILY HISTORY     History reviewed. No pertinent family history.       SOCIAL HISTORY       Social History     Socioeconomic History   ??? Marital status: Married     Spouse name: None   ??? Number of children: None   ??? Years of education: None   ??? Highest education level: None   Occupational History   ??? None   Tobacco Use   ??? Smoking status: Never Smoker   ??? Smokeless tobacco: Never Used   Substance and Sexual Activity   ??? Alcohol  use: Yes     Comment: rarely    ??? Drug use: Never   ??? Sexual activity: None   Other Topics Concern   ??? None   Social History Narrative   ??? None     Social Determinants of Health     Financial Resource Strain:    ??? Difficulty of Paying Living Expenses:    Food Insecurity:    ??? Worried About Programme researcher, broadcasting/film/video in the Last Year:    ??? Barista in the Last Year:    Transportation Needs:    ??? Freight forwarder (Medical):    ??? Lack of Transportation (Non-Medical):    Physical Activity:    ??? Days of Exercise per Week:    ??? Minutes of Exercise per Session:    Stress:    ??? Feeling of Stress :    Social Connections:    ??? Frequency of Communication with Friends and Family:    ??? Frequency of Social Gatherings with Friends and Family:    ??? Attends Religious Services:    ??? Database administrator or Organizations:    ??? Attends Engineer, structural:    ??? Marital Status:    Intimate Programme researcher, broadcasting/film/video Violence:    ??? Fear of Current or Ex-Partner:    ??? Emotionally Abused:    ??? Physically Abused:    ??? Sexually Abused:          PHYSICAL EXAM       ED Triage Vitals [04/17/20 2022]   BP Temp Temp src Pulse Resp SpO2 Height Weight   (!) 155/76 --  -- 52 18 99 % 5\' 3"  (1.6 m) 134 lb (60.8 kg)       Physical Exam  Vitals and nursing note reviewed.   Constitutional:       Appearance: Normal appearance.   HENT:      Head: Normocephalic and atraumatic.      Right Ear: Tympanic membrane normal.      Left Ear: Tympanic membrane normal.      Nose: Nose normal.      Mouth/Throat:      Mouth: Mucous membranes are moist.      Pharynx: Oropharynx is clear.   Eyes:      General: Lids are normal.      Extraocular Movements: Extraocular movements intact.      Conjunctiva/sclera: Conjunctivae normal.      Pupils: Pupils are equal, round, and reactive to light.   Cardiovascular:      Rate and Rhythm: Normal rate and regular rhythm.      Pulses: Normal pulses.      Heart sounds: Normal heart sounds.   Pulmonary:      Effort: Pulmonary effort is normal.      Breath sounds: Normal breath sounds.   Abdominal:      General: Abdomen is flat. Bowel sounds are normal.      Palpations: Abdomen is soft.      Tenderness: There is abdominal tenderness in the epigastric area. There is no guarding or rebound.   Musculoskeletal:         General: Normal range of motion.      Cervical back: Full passive range of motion without pain, normal range of motion and neck supple.   Skin:     General: Skin is warm.      Capillary Refill: Capillary refill takes less than 2 seconds.   Neurological:  General: No focal deficit present.      Mental Status: She is alert and oriented to person, place, and time.      Deep Tendon Reflexes: Reflexes are normal and symmetric.   Psychiatric:         Attention and Perception: Attention and perception normal.         Mood and Affect: Mood normal.         Behavior: Behavior normal. Behavior is cooperative.           LABS:  Labs Reviewed   COMPREHENSIVE METABOLIC PANEL - Abnormal; Notable for the following components:       Result Value    Potassium 3.3 (*)     Anion Gap 8 (*)     Glucose 128 (*)     Albumin 4.8 (*)     All other components within normal limits    AMYLASE - Abnormal; Notable for the following components:    Amylase 142 (*)     All other components within normal limits   CBC WITH AUTO DIFFERENTIAL - Abnormal; Notable for the following components:    RBC 4.08 (*)     MCHC 32.7 (*)     All other components within normal limits   LIPASE         MDM:   Vitals:    Vitals:    04/17/20 2328 04/17/20 2338 04/18/20 0009 04/18/20 0018   BP: 138/67  134/64    Pulse: (!) 45  (!) 45 51   Resp: 17  16    Temp: 97.9 ??F (36.6 ??C) 97.9 ??F (36.6 ??C)     TempSrc: Oral Oral     SpO2: 100%  99%    Weight:       Height:           MDM  Number of Diagnoses or Management Options  Pain of upper abdomen  Diagnosis management comments: Patient presents with left upper quadrant pain.  Labs, CXR, EKG, IV and CT ab/pelvis ordered.  Labs were reviewed and do not show any acute changes with the exception of the amylase being mildly elevated.  She was given Morphine 4mg  IV and zofran 4mg  IV.  CXR: no acute changes.  Her ct scan shows dilation of her pancreatic duct.  No signs of pancreatitis.  The patient will be discharged home.  She will be driving back to  [place] to follow up with her doctor.  I advised her get some Nexium 20mg  tablets that are OTC.  She also will need follow up and possible GI consult for endoscopy. She states she has had gastritis in the past. She was given Rx for Zofran 4mg  ODT.       EKG Interpretation    Interpreted by emergency department physician    Rhythm: sinus bradycardia  Rate: normal  Axis: normal  Ectopy: none  Conduction: normal  ST Segments: no acute change  T Waves: non specific changes  Q Waves: nonspecific    Clinical Impression: non-specific EKG    Diane Weatherly S Myles Tavella, DO     The lab results, radiology and test results were reviewed with the patient and family.  The patient will follow up in 2 days with their primary care doctor.  If their symptoms change or get worse they will return to the ER.    CRITICAL CARE TIME   Total CriticalCare time was 0 minutes,  excluding separately reportable procedures.  There was a high probability of clinically  significant/life threatening deterioration in the patient's condition which required my urgent intervention.        PROCEDURES:  Unlessotherwise noted below, none     Procedures      FINAL IMPRESSION      1. Pain of upper abdomen          DISPOSITION/PLAN   DISPOSITION            Rutherford Guys, DO (electronically signed)  Attending Emergency Physician          Rutherford Guys, DO  04/22/20 0017

## 2020-04-18 ENCOUNTER — Inpatient Hospital Stay
Admit: 2020-04-18 | Discharge: 2020-04-18 | Disposition: A | Discharge status: Home / Self Care | Payer: PRIVATE HEALTH INSURANCE | Attending: Emergency Medicine

## 2020-04-18 LAB — COMPREHENSIVE METABOLIC PANEL
ALT: 12 U/L (ref 0–33)
AST: 13 U/L (ref 0–35)
Albumin: 4.8 g/dL — ABNORMAL HIGH (ref 3.5–4.6)
Alkaline Phosphatase: 76 U/L (ref 40–130)
Anion Gap: 8 mEq/L — ABNORMAL LOW (ref 9–15)
BUN: 11 mg/dL (ref 8–23)
CO2: 31 mEq/L (ref 20–31)
Calcium: 9.7 mg/dL (ref 8.5–9.9)
Chloride: 100 mEq/L (ref 95–107)
Creatinine: 0.68 mg/dL (ref 0.50–0.90)
GFR African American: >60 (ref >60)
GFR Non-African American: >60 (ref >60)
Globulin: 2.6 g/dL (ref 2.3–3.5)
Glucose: 128 mg/dL — ABNORMAL HIGH (ref 70–99)
Potassium: 3.3 mEq/L — ABNORMAL LOW (ref 3.4–4.9)
Sodium: 139 mEq/L (ref 135–144)
Total Bilirubin: <0.2 mg/dL (ref 0.2–0.7)
Total Protein: 7.4 g/dL (ref 6.3–8.0)

## 2020-04-18 LAB — CBC WITH AUTO DIFFERENTIAL
Basophils %: 1 %
Basophils Absolute: 0.1 10*3/uL (ref 0.0–0.2)
Eosinophils %: 0.8 %
Eosinophils Absolute: 0.1 10*3/uL (ref 0.0–0.7)
Hematocrit: 37.1 % (ref 37.0–47.0)
Hemoglobin: 12.1 g/dL (ref 12.0–16.0)
Lymphocytes %: 32 %
Lymphocytes Absolute: 2.1 10*3/uL (ref 1.0–4.8)
MCH: 29.8 pg (ref 27.0–31.3)
MCHC: 32.7 % — ABNORMAL LOW (ref 33.0–37.0)
MCV: 90.9 fL (ref 82.0–100.0)
Monocytes %: 8.2 %
Monocytes Absolute: 0.5 10*3/uL (ref 0.2–0.8)
Neutrophils %: 58 %
Neutrophils Absolute: 3.9 10*3/uL (ref 1.4–6.5)
Platelets: 230 10*3/uL (ref 130–400)
RBC: 4.08 M/uL — ABNORMAL LOW (ref 4.20–5.40)
RDW: 14 % (ref 11.5–14.5)
WBC: 6.6 10*3/uL (ref 4.8–10.8)

## 2020-04-18 LAB — EKG 12-LEAD
Atrial Rate: 47 {beats}/min
P Axis: 23 degrees
P-R Interval: 150 ms
Q-T Interval: 384 ms
QRS Duration: 76 ms
QTc Calculation (Bazett): 339 ms
R Axis: 23 degrees
T Axis: 35 degrees
Ventricular Rate: 47 {beats}/min

## 2020-04-18 LAB — LIPASE: Lipase: 39 U/L (ref 12–95)

## 2020-04-18 LAB — AMYLASE: Amylase: 142 U/L — ABNORMAL HIGH (ref 22–93)

## 2020-04-18 MED ORDER — MORPHINE SULFATE 4 MG/ML IJ SOLN
4 | INTRAMUSCULAR | Status: DC | PRN
Start: 2020-04-18 — End: 2020-04-18
  Administered 2020-04-18: 03:00:00 4 mg via INTRAVENOUS

## 2020-04-18 MED ORDER — HYDROCODONE-ACETAMINOPHEN 5-325 MG PO TABS
5-325 MG | Freq: Once | ORAL | Status: AC
Start: 2020-04-18 — End: 2020-04-17
  Administered 2020-04-18: 04:00:00 1 via ORAL

## 2020-04-18 MED ORDER — ONDANSETRON HCL 4 MG/2ML IJ SOLN
4 MG/2ML | Freq: Once | INTRAMUSCULAR | Status: DC
Start: 2020-04-18 — End: 2020-04-17

## 2020-04-18 MED ORDER — IOPAMIDOL 76 % IV SOLN
76 % | Freq: Once | INTRAVENOUS | Status: AC | PRN
Start: 2020-04-18 — End: 2020-04-17
  Administered 2020-04-18: 03:00:00 100 mL via INTRAVENOUS

## 2020-04-18 MED ORDER — SODIUM CHLORIDE 0.9 % IV BOLUS
0.9 % | Freq: Once | INTRAVENOUS | Status: AC
Start: 2020-04-18 — End: 2020-04-17
  Administered 2020-04-18: 02:00:00 1000 mL via INTRAVENOUS

## 2020-04-18 MED ORDER — PANTOPRAZOLE SODIUM 40 MG PO TBEC
40 MG | Freq: Once | ORAL | Status: AC
Start: 2020-04-18 — End: 2020-04-17
  Administered 2020-04-18: 04:00:00 40 mg via ORAL

## 2020-04-18 MED ORDER — ONDANSETRON HCL 4 MG/2ML IJ SOLN
4 MG/2ML | Freq: Once | INTRAMUSCULAR | Status: AC
Start: 2020-04-18 — End: 2020-04-18
  Administered 2020-04-18: 04:00:00 4 mg via INTRAVENOUS

## 2020-04-18 MED ORDER — ONDANSETRON HCL 4 MG/2ML IJ SOLN
4 MG/2ML | INTRAMUSCULAR | Status: DC
Start: 2020-04-18 — End: 2020-04-18

## 2020-04-18 MED ORDER — ONDANSETRON 4 MG PO TBDP
4 MG | ORAL_TABLET | Freq: Three times a day (TID) | ORAL | 0 refills | Status: AC | PRN
Start: 2020-04-18 — End: ?

## 2020-04-18 MED ORDER — ONDANSETRON HCL 4 MG/2ML IJ SOLN
4 MG/2ML | Freq: Once | INTRAMUSCULAR | Status: AC
Start: 2020-04-18 — End: 2020-04-17
  Administered 2020-04-18: 02:00:00 4 mg via INTRAVENOUS

## 2020-04-18 MED FILL — SODIUM CHLORIDE 0.9 % IV SOLN: 0.9 % | INTRAVENOUS | Qty: 1000

## 2020-04-18 MED FILL — ONDANSETRON HCL 4 MG/2ML IJ SOLN: 4 MG/2ML | INTRAMUSCULAR | Qty: 2

## 2020-04-18 MED FILL — MORPHINE SULFATE 4 MG/ML IJ SOLN: 4 mg/mL | INTRAMUSCULAR | Qty: 1

## 2020-04-18 MED FILL — HYDROCODONE-ACETAMINOPHEN 5-325 MG PO TABS: 5-325 mg | ORAL | Qty: 1

## 2020-04-18 MED FILL — PANTOPRAZOLE SODIUM 40 MG PO TBEC: 40 mg | ORAL | Qty: 1

## 2020-04-18 NOTE — ED Notes (Signed)
Explained discharge instructions and one prescription to patient.  Went over discharge diagnosis and pertinent educational material with patient.  Patient stated understanding of discharge diagnosis, instructions, and prescription.  Patient denies any questions at this time, all concerns addressed.  No signs or symptoms of pain or distress noted at this time.  Patient discharged to home with spouse. A/0 x3, ambulatory, resps even and unlabored on room air. Follow up instructions and reasons to return to ER reviewed. Patient denies needs at time of discharge.      Orvan Seen, RN  04/18/20 Jacinta Shoe

## 2020-04-28 ENCOUNTER — Ambulatory Visit: Payer: Managed Care, Other (non HMO) | Attending: Surgery

## 2020-04-28 ENCOUNTER — Other Ambulatory Visit: Payer: Self-pay

## 2020-04-28 DIAGNOSIS — M25512 Pain in left shoulder: Secondary | ICD-10-CM | POA: Diagnosis not present

## 2020-04-28 DIAGNOSIS — G8929 Other chronic pain: Secondary | ICD-10-CM | POA: Diagnosis present

## 2020-04-28 DIAGNOSIS — R293 Abnormal posture: Secondary | ICD-10-CM

## 2020-04-28 NOTE — Therapy (Signed)
[facility] MAIN [facility] SERVICES [address] [place], [place], [address] Phone: [phone]   Fax:  [phone]  Physical Therapy Evaluation  Patient Details  Name: Kim Lowery MRN: [id number] Date of Birth: Aug 26, 1951 Referring Provider (PT): Leron Croak MD   Encounter Date: 04/28/2020  PT End of Session - 04/28/20 1438    Visit Number  1    Number of Visits  12    Date for PT Re-Evaluation  06/09/20    Authorization Type  1/10 eval 04/28/20    PT Start Time  1100    PT Stop Time  1159    PT Time Calculation (min)  59 min    Activity Tolerance  Patient limited by pain    Behavior During Therapy  [facility] for tasks assessed/performed       Past Medical History:  Diagnosis Date   Allergy    Diabetes mellitus without complication (HCC) 2009?   diet controlled   Hx of pancreatitis 2019   Hypertension     Past Surgical History:  Procedure Laterality Date   ABDOMINAL HYSTERECTOMY  1996   Total   BREAST BIOPSY Left 1972, 1999   CESAREAN SECTION  1988   COLONOSCOPY  2018   HAND ARTHROPLASTY Bilateral 2014    There were no vitals filed for this visit.   Subjective Assessment - 04/28/20 1115    Subjective  Patient is a pleasant 69 year old female who presents for rotator cuff tendinitis, nontraumetic incomplete tear of L rotator cuff    Pertinent History  Patient reports she has had a few injections, went to [place] to visit daughter to help her move. Kept getting progressively worse. Did physical therapy at [facility] and it helped. E-stim helps the most as well as Ultrasound. Worked as a Publishing rights manager; laid off since COVID. Wants to feel better for her anniversary trip to [place] in end of June. PMH includes DM type II, disc disease degenerative, HLD, HTN, PSVT, RAD    Limitations  Sitting;Lifting;Standing;Walking;House hold activities;Writing    How long can you sit comfortably?  pain is at all times    How long can you stand  comfortably?  n/a    How long can you walk comfortably?  hurts to swing arms    Diagnostic tests  MRI on 5/13: IMPRESSION: 1. 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 No full-thickness rotator cuff tear. 2. The labrum and biceps tendon are intact. 3. Mild acromioclavicular and glenohumeral degenerative changes.    Patient Stated Goals  to decrease pain    Currently in Pain?  Yes    Pain Score  2     Pain Location  Shoulder    Pain Orientation  Left    Pain Descriptors / Indicators  Aching    Pain Type  Acute pain    Pain Onset  More than a month ago    Pain Frequency  Intermittent    Aggravating Factors   movement    Pain Relieving Factors  injection    Effect of Pain on Daily Activities  limits         [facility] PT Assessment - 04/28/20 0001      Assessment   Medical Diagnosis   rotator cuff tendinitis L, nontraumatic incomplete tear of L rotator cuff    Referring Provider (PT)  Leron Croak MD    Onset Date/Surgical Date  --   for a few years per patient report  Hand Dominance  Right    Prior Therapy  Yes      Precautions   Precautions  None      Restrictions   Weight Bearing Restrictions  No      Balance Screen   Has the patient fallen in the past 6 months  No    Has the patient had a decrease in activity level because of a fear of falling?   Yes    Is the patient reluctant to leave their home because of a fear of falling?   No      Home Public house manager residence    Living Arrangements  Spouse/significant other    Available Help at Discharge  Family    Type of Home  House    Home Access  Stairs to enter    Entrance Stairs-Number of Steps  3    Entrance Stairs-Rails  Can reach both    Home Layout  One level      Prior Function   Level of Independence  Independent    Vocation  Unemployed   was a Publishing rights manager    Leisure  Gardening, hiking, dog, fly fishing      Cognition   Overall Cognitive Status   Within Functional Limits for tasks assessed      Observation/Other Assessments   Focus on Therapeutic Outcomes (FOTO)   44.8%            PAIN: Worst pain: 10/10 Least amount of pain: 1/10 after injection Current: ranges between 1-6/10   POSTURE: L shoulder protraction  PROM/AROM:  Right Left  Shoulder Flexion WFL 170  Shoulder Abduction WFL 164 * pain  ER WFL Limited by pain; unable to reach testing position; at side of body 10 degrees   IR WFL WFL   Scapula: Decreased mobility in posterior medial direction, improved with grade I-II glides  STRENGTH:  Graded on a 0-5 scale Muscle Group Left Right  Shoulder flex 4-/5 5/5  Shoulder Abd 3/5 pain 5/5  Shoulder Ext 3+/5 5/5  Shoulder IR/ER 2+/5 pain 5/5  Elbow 4+/5 5/5  Wrist/hand 4+/5  5/5   SENSATION: WNL  SPECIAL TESTS: Leanord Asal + LUE Anterior Apprehension - External rotation lag sign : pain disrupt  Neer + LUE  - Labral tests LUE  + upper trap test L - liftoff test  (pain but can lift)   Education on bed mobility:  Education on placement of pillows for sleeping for pain reduction   GAIT: Decreased arm swing LUE  OUTCOME MEASURES: TEST Outcome Interpretation  FOTO 44.8% Discharge score of 65 recommended.   QuickDash  45.5%   QuickDash work: gardening 50%   QuickDash sport 50%               treat while performing HEP:  pulsed 50% x 10 min 1.2w/cm2;  Access Code: [email or web address] URL: [email or web address] Date: 04/28/2020 Prepared by: Precious Bard  Exercises Seated Scapular Retraction - 1 x daily - 7 x weekly - 10 reps - 2 sets - 5 hold Standing Horizontal Shoulder Pendulum Supported with Arm Bent - 1 x daily - 7 x weekly - 10 reps - 2 sets - 5 hold Seated Upper Trapezius Stretch - 1 x daily - 7 x weekly - 2 sets - 10 reps - 20 hold Seated Child's Pose with Table - 1 x daily - 7 x weekly - 10 reps - 2 sets - 5 hold  Objective measurements completed  on examination: See  above findings.            PT Education - 04/28/20 1438    Education provided  Yes    Education Details  goals, POC, HEP    Person(s) Educated  Patient    Methods  Explanation;Demonstration;Tactile cues;Verbal cues;Handout    Comprehension  Verbalized understanding;Returned demonstration;Verbal cues required;Tactile cues required       PT Short Term Goals - 04/28/20 1659      PT SHORT TERM GOAL #1   Title  Patient will be independent in home exercise program to improve strength/mobility for better functional independence with ADLs    Baseline  6/2; HEP given    Time  2    Period  Weeks    Status  New    Target Date  05/12/20        PT Long Term Goals - 04/28/20 1659      PT LONG TERM GOAL #1   Title  Patient will increase FOTO score to equal to or greater than  65% to demonstrate statistically significant improvement in mobility and quality of life.    Baseline  6/2: 44.8%    Time  6    Period  Weeks    Status  New    Target Date  06/09/20      PT LONG TERM GOAL #2   Title  Patient will report a worst pain of 3/10 on VAS in shoulder  to improve tolerance with ADLs and reduced symptoms with activities.    Baseline  6/2: 10/10    Time  6    Period  Weeks    Status  New    Target Date  06/09/20      PT LONG TERM GOAL #3   Title  Patient will decrease Quick DASH score by > 8 points (37%) demonstrating reduced self-reported upper extremity disability.    Baseline  6/2: 45.5%    Time  6    Period  Weeks    Status  New    Target Date  06/09/20             Plan - 04/28/20 1513    Clinical Impression Statement  Patient is a pleasant 69 year old female who presents with L shoulder pain. Evaluation shows deficits of strength limited by pain that is in agreeance with referring diagnosis of partial tear of rotator cuff musculature and tendonitis. Patient does have functional range of motion but is painful indicating need for pain control prior to strengthening  regime. Patient will benefit from skilled physical therapy to reduce pain, improve strength, and return patient to prior level of function.    Personal Factors and Comorbidities  Comorbidity 3+;Profession;Age;Time since onset of injury/illness/exacerbation;Past/Current Experience    Comorbidities  DM type II, disc disease degenerative, HLD, HTN, PSVT, RAD    Examination-Activity Limitations  Bed Mobility;Caring for Others;Carry;Dressing;Hygiene/Grooming;Lift;Reach Overhead;Sleep    Examination-Participation Restrictions  Cleaning;Community Activity;Driving;Laundry;Volunteer;Shop;Meal Prep;Yard Work;Other    Stability/Clinical Decision Making  Evolving/Moderate complexity    Clinical Decision Making  Moderate    Rehab Potential  Good    PT Frequency  2x / week    PT Duration  6 weeks    PT Treatment/Interventions  Cryotherapy;Electrical Stimulation;Iontophoresis 4mg /ml Dexamethasone;Moist Heat;Therapeutic exercise;Therapeutic activities;Ultrasound;Neuromuscular re-education;Patient/family education;Manual techniques;Passive range of motion;Compression bandaging;Dry needling;Energy conservation;Visual/perceptual remediation/compensation;Vasopneumatic Device;Taping;Joint Manipulations    PT Next Visit Plan  likes estim so try that, reduce upper trap tightness, posture, isometrics    PT Home  Exercise Plan  see in note    Consulted and Agree with Plan of Care  Patient       Patient will benefit from skilled therapeutic intervention in order to improve the following deficits and impairments:  Decreased activity tolerance, Decreased endurance, Decreased mobility, Decreased range of motion, Decreased strength, Impaired flexibility, Impaired UE functional use, Improper body mechanics, Pain, Postural dysfunction  Visit Diagnosis: Chronic left shoulder pain  Abnormal posture     Problem List Patient Active Problem List   Diagnosis Date Noted   Hyperlipidemia 10/11/2018   Overweight (BMI  25.0-29.9) 10/11/2018   Type 2 diabetes mellitus with hyperglycemia, without long-term current use of insulin (HCC) 06/14/2018   Abdominal pain, right upper quadrant 05/29/2018   Biliary dyskinesia 05/29/2018   Precious Bard, PT, DPT   04/28/2020, 5:03 PM  [facility] MAIN [facility] SERVICES [address] [place], [place], [address] Phone: [phone]   Fax:  [phone]  Name: Kim Lowery MRN: [id number] Date of Birth: [birth year]

## 2020-04-28 NOTE — Patient Instructions (Signed)
Access Code: [email or web address] URL: [email or web address] Date: 04/28/2020 Prepared by: Precious Bard  Exercises Seated Scapular Retraction - 1 x daily - 7 x weekly - 10 reps - 2 sets - 5 hold Standing Horizontal Shoulder Pendulum Supported with Arm Bent - 1 x daily - 7 x weekly - 10 reps - 2 sets - 5 hold Seated Upper Trapezius Stretch - 1 x daily - 7 x weekly - 2 sets - 10 reps - 20 hold Seated Child's Pose with Table - 1 x daily - 7 x weekly - 10 reps - 2 sets - 5 hold

## 2020-05-04 ENCOUNTER — Ambulatory Visit: Payer: Managed Care, Other (non HMO)

## 2020-05-04 ENCOUNTER — Other Ambulatory Visit: Payer: Self-pay

## 2020-05-04 DIAGNOSIS — M25512 Pain in left shoulder: Secondary | ICD-10-CM | POA: Diagnosis not present

## 2020-05-04 DIAGNOSIS — G8929 Other chronic pain: Secondary | ICD-10-CM

## 2020-05-04 DIAGNOSIS — R293 Abnormal posture: Secondary | ICD-10-CM

## 2020-05-04 NOTE — Therapy (Signed)
[facility] MAIN [facility] SERVICES [address] [place], [place], [address] Phone: [phone]   Fax:  [phone]  Physical Therapy Treatment  Patient Details  Name: Kim Lowery MRN: [id number] Date of Birth: 1951/01/27 Referring Provider (PT): Leron Croak MD   Encounter Date: 05/04/2020  PT End of Session - 05/04/20 0927    Visit Number  2    Number of Visits  12    Date for PT Re-Evaluation  06/09/20    Authorization Type  2/10 eval 04/28/20    PT Start Time  0930    PT Stop Time  1014    PT Time Calculation (min)  44 min    Activity Tolerance  Patient limited by pain;Patient tolerated treatment well    Behavior During Therapy  [facility] for tasks assessed/performed       Past Medical History:  Diagnosis Date  . Allergy   . Diabetes mellitus without complication (HCC) 2009?   diet controlled  . Hx of pancreatitis 2019  . Hypertension     Past Surgical History:  Procedure Laterality Date  . ABDOMINAL HYSTERECTOMY  1996   Total  . BREAST BIOPSY Left 1972, 1999  . CESAREAN SECTION  1988  . COLONOSCOPY  2018  . HAND ARTHROPLASTY Bilateral 2014    There were no vitals filed for this visit.  Subjective Assessment - 05/04/20 1229    Subjective  Patient reports decreased pain with performance of HEP's. No falls or LOB since last session.    Pertinent History  Patient reports she has had a few injections, went to [place] to visit daughter to help her move. Kept getting progressively worse. Did physical therapy at [facility] and it helped. E-stim helps the most as well as Ultrasound. Worked as a Publishing rights manager; laid off since COVID. Wants to feel better for her anniversary trip to [place] in end of June. PMH includes DM type II, disc disease degenerative, HLD, HTN, PSVT, RAD    Limitations  Sitting;Lifting;Standing;Walking;House hold activities;Writing    How long can you sit comfortably?  pain is at all times    How long can you stand  comfortably?  n/a    How long can you walk comfortably?  hurts to swing arms    Diagnostic tests  MRI on 5/13: IMPRESSION: 1. 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 No full-thickness rotator cuff tear. 2. The labrum and biceps tendon are intact. 3. Mild acromioclavicular and glenohumeral degenerative changes.    Patient Stated Goals  to decrease pain    Currently in Pain?  Yes    Pain Score  2     Pain Location  Shoulder    Pain Orientation  Left    Pain Descriptors / Indicators  Aching    Pain Type  Acute pain    Pain Onset  More than a month ago    Pain Frequency  Intermittent        1-2/10 pain   E stim pulsed 50% x 10 min 1.2w/cm2; while performing manual and threrex   Ultrasound: pulsed 50% x 10 min 1.4w/cm2 with patient supine lying, head and LE's supported   TherEx:   isometrics: cues for 50-60% muscle activation  -flexion into PT hand 10x 3 second holds -abduction into PT hand 10x 3 second holds -adduction into PT hand 10x3 second holds -extension into PT hand 10x 3 second holds -ER into PT hand 10x 3 second holds -IR into PT hand  10x 3 second holds   Manual:   Rhythmic pertubation's for reduction of muscle guarding/pain relief performed intermittently throughout session Distraction with arc of motion 10x 8 second holds AP glenohumeral mobilizations in varying arc of abduction for reduction of guarding grade II-III ; 8 bouts of 15 seconds  Bicep trigger point with movement x 8 minutes, multiple trigger points  suboccipital release 2x 30 second holds  Side bending for upper trap release with overpressure to shoulder and occiput 3x 20 second holds Scapular depression with retraction grade II-III mobilization x 3 minutes       Pt educated throughout session about proper posture and technique with exercises. Improved exercise technique, movement at target joints, use of target muscles after min to mod verbal, visual, tactile  cues                 PT Education - 05/04/20 0926    Education provided  Yes    Education Details  HEP compliance, manual, modalities, posture    Person(s) Educated  Patient    Methods  Explanation;Demonstration;Tactile cues;Verbal cues    Comprehension  Verbalized understanding;Returned demonstration;Verbal cues required;Tactile cues required       PT Short Term Goals - 04/28/20 1659      PT SHORT TERM GOAL #1   Title  Patient will be independent in home exercise program to improve strength/mobility for better functional independence with ADLs    Baseline  6/2; HEP given    Time  2    Period  Weeks    Status  New    Target Date  05/12/20        PT Long Term Goals - 04/28/20 1659      PT LONG TERM GOAL #1   Title  Patient will increase FOTO score to equal to or greater than  65% to demonstrate statistically significant improvement in mobility and quality of life.    Baseline  6/2: 44.8%    Time  6    Period  Weeks    Status  New    Target Date  06/09/20      PT LONG TERM GOAL #2   Title  Patient will report a worst pain of 3/10 on VAS in shoulder  to improve tolerance with ADLs and reduced symptoms with activities.    Baseline  6/2: 10/10    Time  6    Period  Weeks    Status  New    Target Date  06/09/20      PT LONG TERM GOAL #3   Title  Patient will decrease Quick DASH score by > 8 points (37%) demonstrating reduced self-reported upper extremity disability.    Baseline  6/2: 45.5%    Time  6    Period  Weeks    Status  New    Target Date  06/09/20            Plan - 05/04/20 1232    Clinical Impression Statement  Patient presents with good motivation. She is very pain centric minded and requires cueing for relaxation for reduction of muscle guarding. Isometrics performed without pain increase. Upper trap musculature continues to be limited with slight head tilt noted. Pain reduced with manual and therex with patient reporting no pain at end  of session. Patient will benefit from skilled physical therapy to reduce pain, improve strength, and return patient to prior level of function.    Personal Factors and Comorbidities  Comorbidity 3+;Profession;Age;Time since onset  of injury/illness/exacerbation;Past/Current Experience    Comorbidities  DM type II, disc disease degenerative, HLD, HTN, PSVT, RAD    Examination-Activity Limitations  Bed Mobility;Caring for Others;Carry;Dressing;Hygiene/Grooming;Lift;Reach Overhead;Sleep    Examination-Participation Restrictions  Cleaning;Community Activity;Driving;Laundry;Volunteer;Shop;Meal Prep;Yard Work;Other    Stability/Clinical Decision Making  Evolving/Moderate complexity    Rehab Potential  Good    PT Frequency  2x / week    PT Duration  6 weeks    PT Treatment/Interventions  Cryotherapy;Electrical Stimulation;Iontophoresis 4mg /ml Dexamethasone;Moist Heat;Therapeutic exercise;Therapeutic activities;Ultrasound;Neuromuscular re-education;Patient/family education;Manual techniques;Passive range of motion;Compression bandaging;Dry needling;Energy conservation;Visual/perceptual remediation/compensation;Vasopneumatic Device;Taping;Joint Manipulations    PT Next Visit Plan  likes estim so try that, reduce upper trap tightness, posture, isometrics    PT Home Exercise Plan  see in note    Consulted and Agree with Plan of Care  Patient       Patient will benefit from skilled therapeutic intervention in order to improve the following deficits and impairments:  Decreased activity tolerance, Decreased endurance, Decreased mobility, Decreased range of motion, Decreased strength, Impaired flexibility, Impaired UE functional use, Improper body mechanics, Pain, Postural dysfunction  Visit Diagnosis: Chronic left shoulder pain  Abnormal posture     Problem List Patient Active Problem List   Diagnosis Date Noted  . Hyperlipidemia 10/11/2018  . Overweight (BMI 25.0-29.9) 10/11/2018  . Type 2 diabetes  mellitus with hyperglycemia, without long-term current use of insulin ([place]) 06/14/2018  . Abdominal pain, right upper quadrant 05/29/2018  . Biliary dyskinesia 05/29/2018   Janna Arch, PT, DPT   05/04/2020, 12:33 PM  [place] MAIN [facility] SERVICES [address] [place], [place], [address] Phone: [phone]   Fax:  [phone]  Name: Kim Lowery MRN: [id number] Date of Birth: November 13, 1951

## 2020-05-06 ENCOUNTER — Other Ambulatory Visit: Payer: Self-pay

## 2020-05-06 ENCOUNTER — Ambulatory Visit: Payer: Managed Care, Other (non HMO)

## 2020-05-06 DIAGNOSIS — R293 Abnormal posture: Secondary | ICD-10-CM

## 2020-05-06 DIAGNOSIS — G8929 Other chronic pain: Secondary | ICD-10-CM

## 2020-05-06 DIAGNOSIS — M25512 Pain in left shoulder: Secondary | ICD-10-CM | POA: Diagnosis not present

## 2020-05-06 IMAGING — US US OUTSIDE FILMS BODY
1 series · 14 of 16 positions shown · non-contrast
Comparison: none

[Series 1: us outside films body · 14 of 46 slices shown]
[im 1/46]
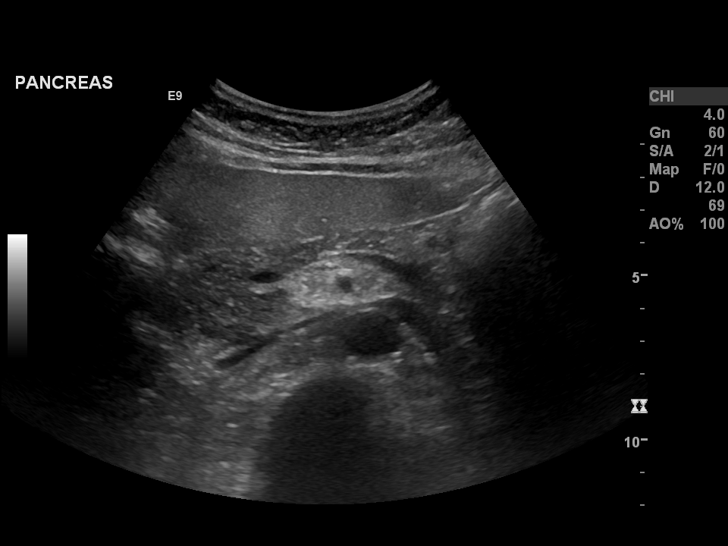
[im 4/46]
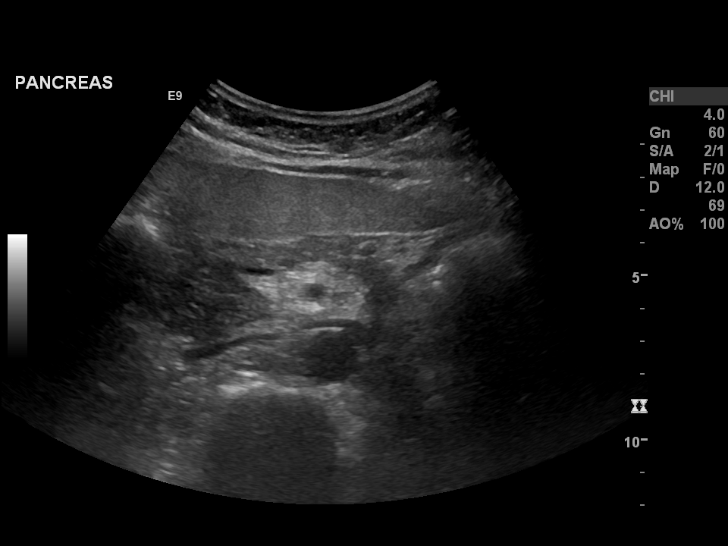
[im 7/46]
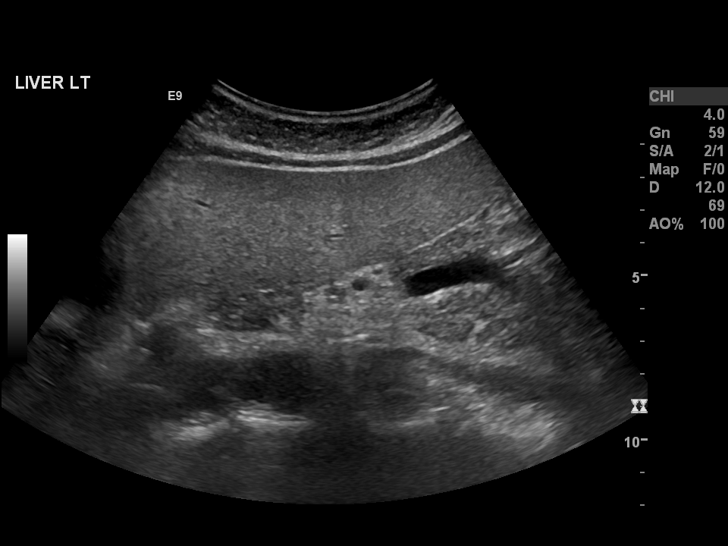
[im 13/46]
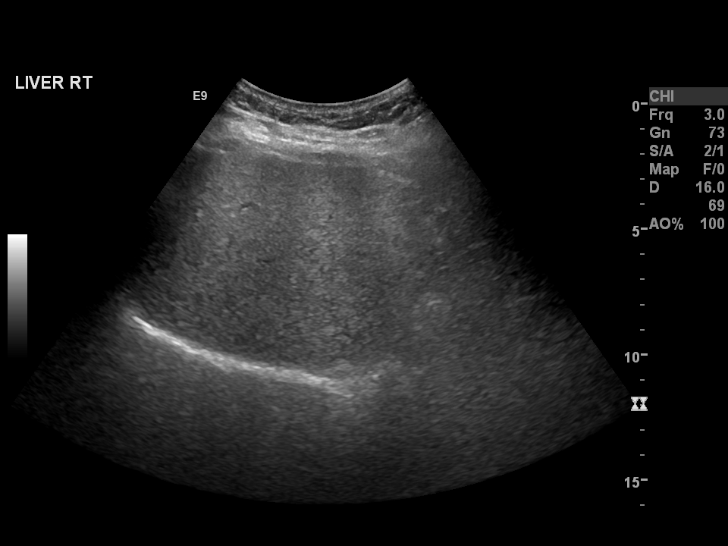
[im 16/46]
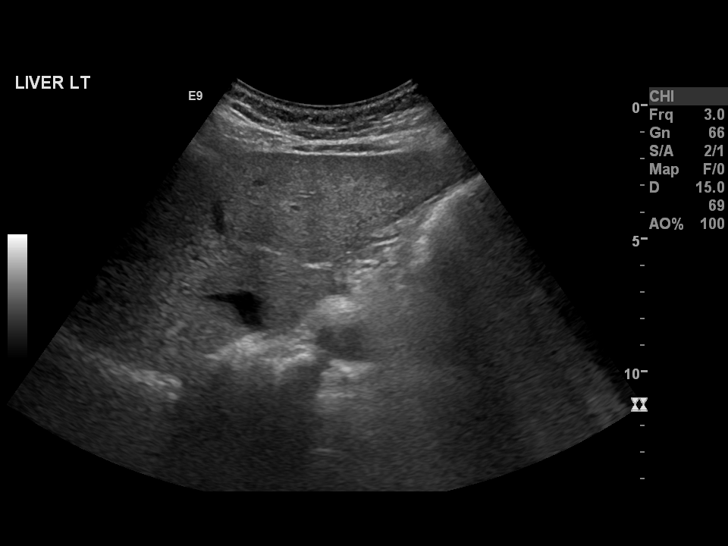
[im 19/46]
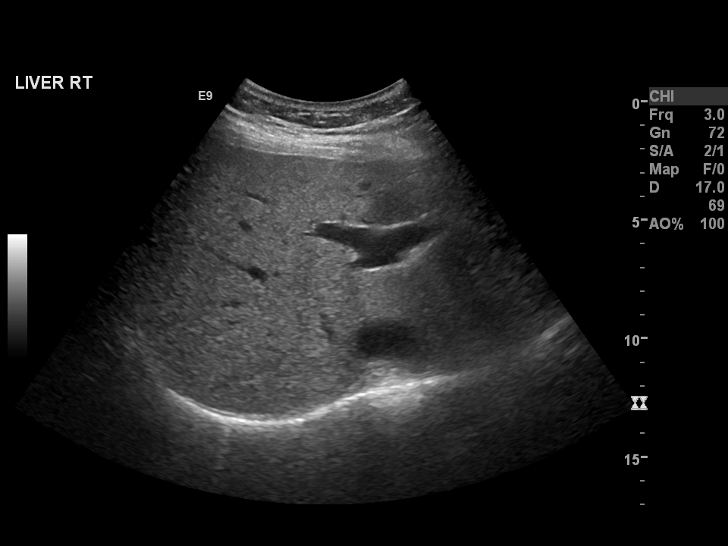
[im 22/46]
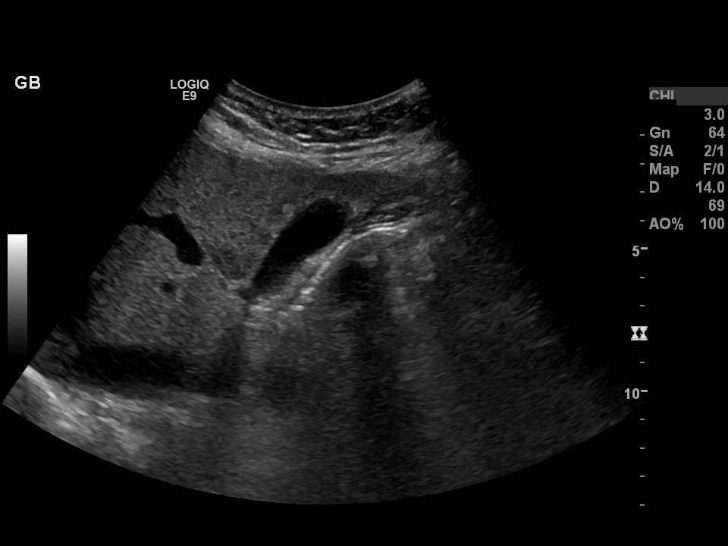
[im 25/46]
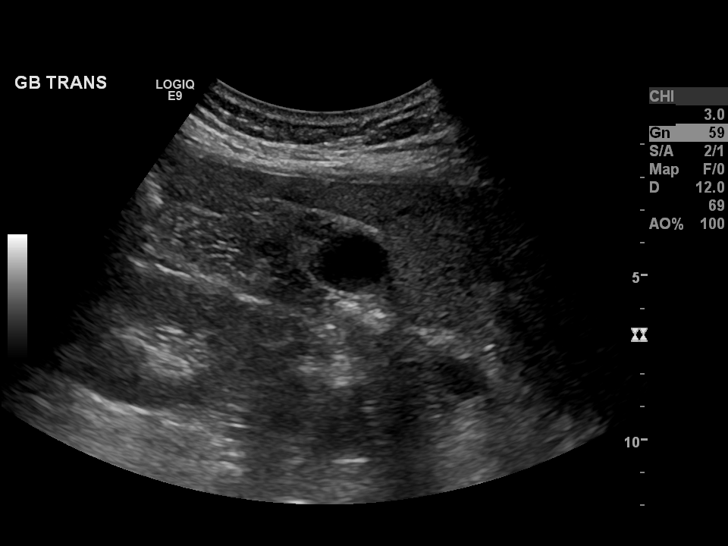
[im 28/46]
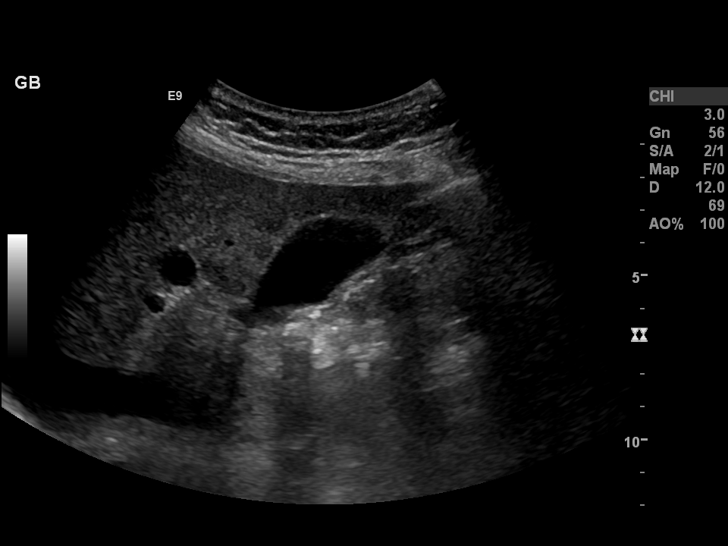
[im 31/46]
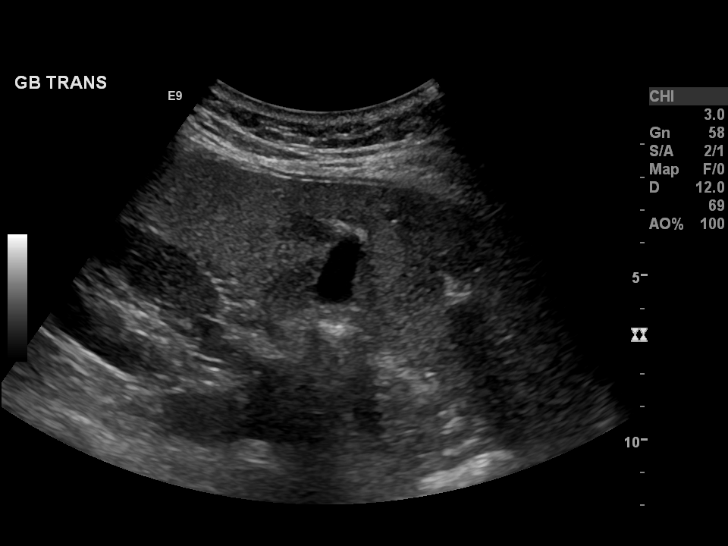
[im 37/46]
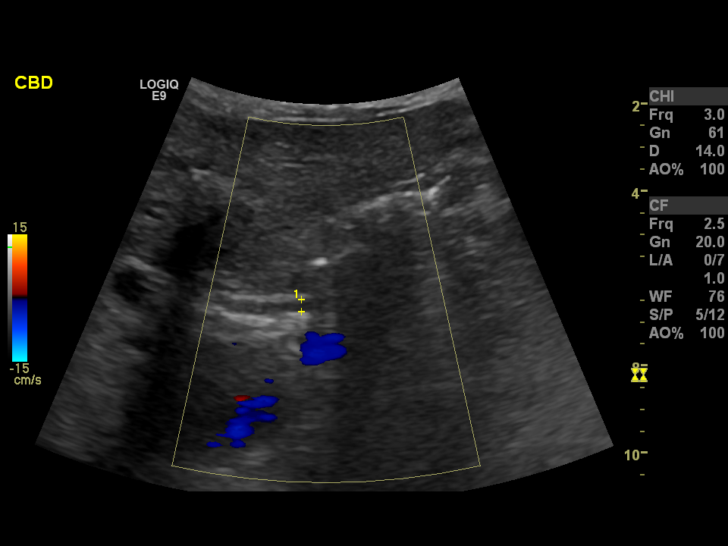
[im 40/46]
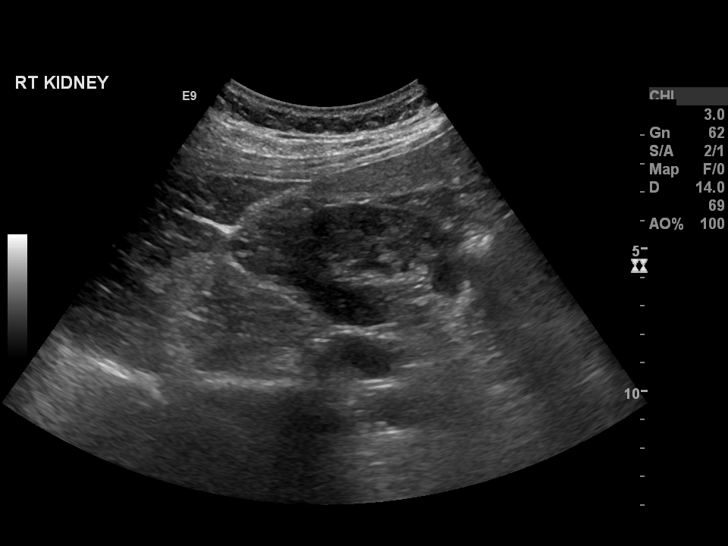
[im 43/46]
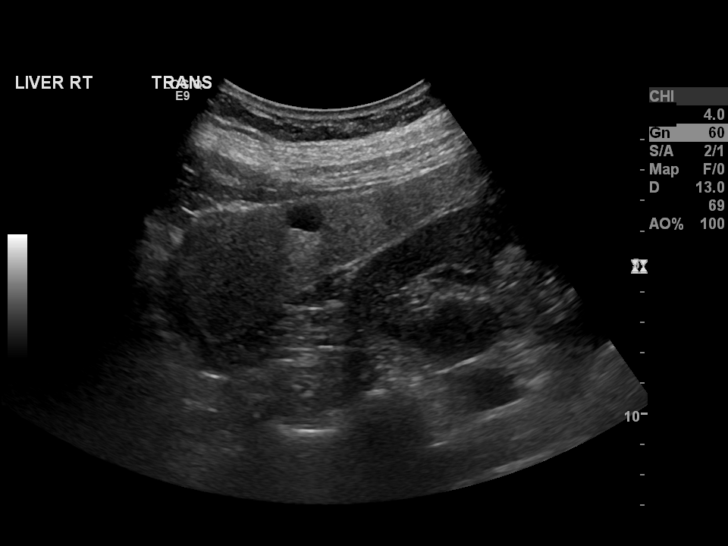
[im 46/46]
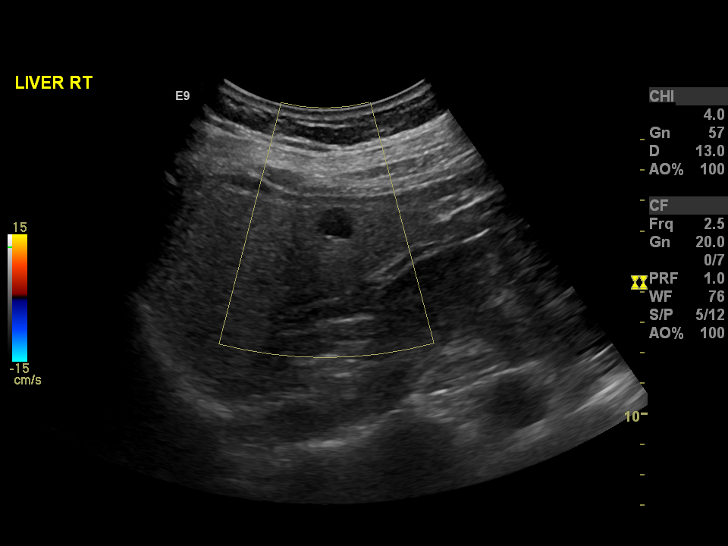

[14 of 16 positions shown; findings below may reference images not displayed]

Canned report from images found in remote index.

Refer to host system for actual result text.

## 2020-05-06 NOTE — Therapy (Signed)
[facility] MAIN [facility] SERVICES [address] [place], [place], [address] Phone: [phone]   Fax:  [phone]  Physical Therapy Treatment  Patient Details  Name: Kim Lowery MRN: [id number] Date of Birth: 05-Sep-1951 Referring Provider (PT): Leron Croak MD   Encounter Date: 05/06/2020   PT End of Session - 05/06/20 0902    Visit Number 3    Number of Visits 12    Date for PT Re-Evaluation 06/09/20    Authorization Type 3/10 eval 04/28/20    PT Start Time 0845    PT Stop Time 0930    PT Time Calculation (min) 45 min    Activity Tolerance Patient tolerated treatment well    Behavior During Therapy [facility] for tasks assessed/performed           Past Medical History:  Diagnosis Date  . Allergy   . Diabetes mellitus without complication (HCC) 2009?   diet controlled  . Hx of pancreatitis 2019  . Hypertension     Past Surgical History:  Procedure Laterality Date  . ABDOMINAL HYSTERECTOMY  1996   Total  . BREAST BIOPSY Left 1972, 1999  . CESAREAN SECTION  1988  . COLONOSCOPY  2018  . HAND ARTHROPLASTY Bilateral 2014    There were no vitals filed for this visit.   Subjective Assessment - 05/06/20 0859    Subjective Patient reports her shoulder was a little irritated after the last therapy session. Overall she reports that her shoulder pain is improving. Complains of 2/10 L shoulder pain upon arrival. No specific questions or concerns upon arrival.    Pertinent History Patient reports she has had a few injections, went to [place] to visit daughter to help her move. Kept getting progressively worse. Did physical therapy at [facility] and it helped. E-stim helps the most as well as Ultrasound. Worked as a Publishing rights manager; laid off since COVID. Wants to feel better for her anniversary trip to [place] in end of June. PMH includes DM type II, disc disease degenerative, HLD, HTN, PSVT, RAD    Limitations Sitting;Lifting;Standing;Walking;House hold  activities;Writing    How long can you sit comfortably? pain is at all times    How long can you stand comfortably? n/a    How long can you walk comfortably? hurts to swing arms    Diagnostic tests MRI on 5/13: IMPRESSION: 1. 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 No full-thickness rotator cuff tear. 2. The labrum and biceps tendon are intact. 3. Mild acromioclavicular and glenohumeral degenerative changes.    Patient Stated Goals to decrease pain    Currently in Pain? Yes    Pain Score 2     Pain Location Shoulder    Pain Orientation Left    Pain Descriptors / Indicators Aching    Pain Type Acute pain    Pain Onset More than a month ago    Pain Frequency Intermittent                TREATMENT   Electrical Stimulation  IFC in crossed pattern over L shoulder with [place] until at default settings (carrier freq 4000Hz , Freq 80/150Hz , vector scan off) L shoulder at pt tolerated intensity of 1V with moist heat pack applied to anterior and posterior shoulder;   Ther-ex  Isometrics: 20-30% muscle activation, 5-10s holds with manual resistance from therapist: Flexion x 3; Extension x 3; Abduction x 3; Adduction x 3; ER x 3; IR x 3;   Manual Therapy  Gentle L shoulder PROM into flexion, abduction, IR, and ER; L shoulder AP mobilizations, grade I-II, 20s/bout x 3 bouts; L shoulder superior to inferior mobilization at 90 abduction, grade I-II, 20s/bout x 3 bouts; Gentle L shoulder distraction mobilization during PROM abduction arc of motion; STM with occasional instrument assist and intermittent trigger point release to anterior, lateral, and posterior shoulder as well as upper trap. Pt reports the most relief with TrP release to distal infraspinatus on posterior aspect of shoulder;   Pt educated throughout session about proper posture and technique with exercises. Improved exercise technique, movement at target joints, use of target  muscles after min to mod verbal, visual, tactile cues   Pt reports slight irritation after last session so decreased intensity of isometric strengthening during session today. She reports relief of L shoulder pain with moist heat and IFC. Utilized manual techniques to improve tissue extensibility and for trigger point release. Pt reports the most relief with TrP release to distal infraspinatus on posterior aspect of shoulder. Pt encouraged to continue HEP and follow-up as scheduled.                       PT Short Term Goals - 04/28/20 1659      PT SHORT TERM GOAL #1   Title Patient will be independent in home exercise program to improve strength/mobility for better functional independence with ADLs    Baseline 6/2; HEP given    Time 2    Period Weeks    Status New    Target Date 05/12/20             PT Long Term Goals - 04/28/20 1659      PT LONG TERM GOAL #1   Title Patient will increase FOTO score to equal to or greater than  65% to demonstrate statistically significant improvement in mobility and quality of life.    Baseline 6/2: 44.8%    Time 6    Period Weeks    Status New    Target Date 06/09/20      PT LONG TERM GOAL #2   Title Patient will report a worst pain of 3/10 on VAS in shoulder  to improve tolerance with ADLs and reduced symptoms with activities.    Baseline 6/2: 10/10    Time 6    Period Weeks    Status New    Target Date 06/09/20      PT LONG TERM GOAL #3   Title Patient will decrease Quick DASH score by > 8 points (37%) demonstrating reduced self-reported upper extremity disability.    Baseline 6/2: 45.5%    Time 6    Period Weeks    Status New    Target Date 06/09/20                 Plan - 05/06/20 0903    Clinical Impression Statement Pt reports slight irritation after last session so decreased intensity of isometric strengthening during session today. She reports relief of L shoulder pain with moist heat and IFC.  Utilized manual techniques to improve tissue extensibility and for trigger point release. Pt reports the most relief with TrP release to distal infraspinatus on posterior aspect of shoulder. Pt encouraged to continue HEP and follow-up as scheduled.    Personal Factors and Comorbidities Comorbidity 3+;Profession;Age;Time since onset of injury/illness/exacerbation;Past/Current Experience    Comorbidities DM type II, disc disease degenerative, HLD, HTN, PSVT, RAD    Examination-Activity Limitations Bed Mobility;Caring  for Others;Carry;Dressing;Hygiene/Grooming;Lift;Reach Overhead;Sleep    Examination-Participation Restrictions Cleaning;Community Activity;Driving;Laundry;Volunteer;Shop;Meal Prep;Yard Work;Other    Stability/Clinical Decision Making Evolving/Moderate complexity    Clinical Decision Making Moderate    Rehab Potential Good    PT Frequency 2x / week    PT Duration 6 weeks    PT Treatment/Interventions Cryotherapy;Electrical Stimulation;Iontophoresis 4mg /ml Dexamethasone;Moist Heat;Therapeutic exercise;Therapeutic activities;Ultrasound;Neuromuscular re-education;Patient/family education;Manual techniques;Passive range of motion;Compression bandaging;Dry needling;Energy conservation;Visual/perceptual remediation/compensation;Vasopneumatic Device;Taping;Joint Manipulations    PT Next Visit Plan likes estim so try that, reduce upper trap tightness, posture, isometrics    PT Home Exercise Plan see in note    Consulted and Agree with Plan of Care Patient           Patient will benefit from skilled therapeutic intervention in order to improve the following deficits and impairments:  Decreased activity tolerance, Decreased endurance, Decreased mobility, Decreased range of motion, Decreased strength, Impaired flexibility, Impaired UE functional use, Improper body mechanics, Pain, Postural dysfunction  Visit Diagnosis: Chronic left shoulder pain  Abnormal posture     Problem List Patient  Active Problem List   Diagnosis Date Noted  . Hyperlipidemia 10/11/2018  . Overweight (BMI 25.0-29.9) 10/11/2018  . Type 2 diabetes mellitus with hyperglycemia, without long-term current use of insulin ([place]) 06/14/2018  . Abdominal pain, right upper quadrant 05/29/2018  . Biliary dyskinesia 05/29/2018     Phillips Grout PT, DPT, GCS  [facility] [place] 05/06/2020, 11:14 AM  [place] MAIN [facility] SERVICES [address] [place], [place], [address] Phone: [phone]   Fax:  [phone]  Name: Shayda Kalka MRN: [id number] Date of Birth: 1951-10-02

## 2020-05-10 ENCOUNTER — Other Ambulatory Visit: Payer: Self-pay

## 2020-05-10 ENCOUNTER — Ambulatory Visit: Payer: Managed Care, Other (non HMO)

## 2020-05-10 DIAGNOSIS — R293 Abnormal posture: Secondary | ICD-10-CM

## 2020-05-10 DIAGNOSIS — G8929 Other chronic pain: Secondary | ICD-10-CM

## 2020-05-10 DIAGNOSIS — M25512 Pain in left shoulder: Secondary | ICD-10-CM | POA: Diagnosis not present

## 2020-05-10 NOTE — Therapy (Signed)
[facility] MAIN [facility] SERVICES [address] [place], [place], [address] Phone: [phone]   Fax:  [phone]  Physical Therapy Treatment  Patient Details  Name: Kim Lowery MRN: [id number] Date of Birth: 1951/08/13 Referring Provider (PT): Leron Croak MD   Encounter Date: 05/10/2020   PT End of Session - 05/10/20 0943    Visit Number 4    Number of Visits 12    Date for PT Re-Evaluation 06/09/20    Authorization Type 4/10 eval 04/28/20    PT Start Time 0846    PT Stop Time 0929    PT Time Calculation (min) 43 min    Activity Tolerance Patient tolerated treatment well    Behavior During Therapy [facility] for tasks assessed/performed           Past Medical History:  Diagnosis Date  . Allergy   . Diabetes mellitus without complication (HCC) 2009?   diet controlled  . Hx of pancreatitis 2019  . Hypertension     Past Surgical History:  Procedure Laterality Date  . ABDOMINAL HYSTERECTOMY  1996   Total  . BREAST BIOPSY Left 1972, 1999  . CESAREAN SECTION  1988  . COLONOSCOPY  2018  . HAND ARTHROPLASTY Bilateral 2014    There were no vitals filed for this visit.   Subjective Assessment - 05/10/20 0941    Subjective Patient reports her pain is improving, was able to wake up without pain in shoulder.    Pertinent History Patient reports she has had a few injections, went to [place] to visit daughter to help her move. Kept getting progressively worse. Did physical therapy at [facility] and it helped. E-stim helps the most as well as Ultrasound. Worked as a Publishing rights manager; laid off since COVID. Wants to feel better for her anniversary trip to [place] in end of June. PMH includes DM type II, disc disease degenerative, HLD, HTN, PSVT, RAD    Limitations Sitting;Lifting;Standing;Walking;House hold activities;Writing    How long can you sit comfortably? pain is at all times    How long can you stand comfortably? n/a    How long can you walk  comfortably? hurts to swing arms    Diagnostic tests MRI on 5/13: IMPRESSION: 1. 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 No full-thickness rotator cuff tear. 2. The labrum and biceps tendon are intact. 3. Mild acromioclavicular and glenohumeral degenerative changes.    Patient Stated Goals to decrease pain    Currently in Pain? No/denies                  TREATMENT     Electrical Stimulation  IFC in crossed pattern over L shoulder with [place] until at default settings (carrier freq 4000Hz , Freq 80/150Hz , vector scan off) L shoulder at pt tolerated intensity of 1V with moist heat pack applied to anterior and posterior shoulder;    Patient educated on new device for home: picture taken of pad placement. Educated on safety of wear: not to use in shower, in rain, near water. Not to use >30 minutes-1 hour.   Ther-ex  Isometrics: 20-30% muscle activation, 5-10s holds with manual resistance from therapist: Flexion x 3; Extension x 3; Abduction x 3; Adduction x 3; ER x 3; IR x 3;     Manual Therapy  Gentle L shoulder PROM into flexion, abduction, IR, and ER; L shoulder AP mobilizations, grade I-II, 20s/bout x 3 bouts; L shoulder superior to inferior mobilization at 90 abduction, grade I-II,  20s/bout x 3 bouts; Gentle L shoulder distraction mobilization during PROM abduction arc of motion; STM with occasional instrument assist and intermittent trigger point release to anterior, lateral, and posterior shoulder as well as upper trap. Pt reports the most relief with TrP release to distal infraspinatus on posterior aspect of shoulder;      Pt educated throughout session about proper posture and technique with exercises. Improved exercise technique, movement at target joints, use of target muscles after min to mod verbal, visual, tactile cues  Patient tolerated session well, was able to reach behind back without pain for first time since evaluation.  Education on use of new TENS unit for home with picture taken of pad placement. One more session prior to trip to [place] for continued pain control and posture correction. Patient will benefit from skilled physical therapy to reduce pain, improve strength, and return patient to prior level of function                   PT Education - 05/10/20 0942    Education provided Yes    Education Details exercise technique, body mechanics, TENS home unit    Person(s) Educated Patient    Methods Explanation;Demonstration;Tactile cues;Verbal cues    Comprehension Verbalized understanding;Returned demonstration;Verbal cues required;Tactile cues required            PT Short Term Goals - 04/28/20 1659      PT SHORT TERM GOAL #1   Title Patient will be independent in home exercise program to improve strength/mobility for better functional independence with ADLs    Baseline 6/2; HEP given    Time 2    Period Weeks    Status New    Target Date 05/12/20             PT Long Term Goals - 04/28/20 1659      PT LONG TERM GOAL #1   Title Patient will increase FOTO score to equal to or greater than  65% to demonstrate statistically significant improvement in mobility and quality of life.    Baseline 6/2: 44.8%    Time 6    Period Weeks    Status New    Target Date 06/09/20      PT LONG TERM GOAL #2   Title Patient will report a worst pain of 3/10 on VAS in shoulder  to improve tolerance with ADLs and reduced symptoms with activities.    Baseline 6/2: 10/10    Time 6    Period Weeks    Status New    Target Date 06/09/20      PT LONG TERM GOAL #3   Title Patient will decrease Quick DASH score by > 8 points (37%) demonstrating reduced self-reported upper extremity disability.    Baseline 6/2: 45.5%    Time 6    Period Weeks    Status New    Target Date 06/09/20                 Plan - 05/10/20 0944    Clinical Impression Statement Patient tolerated session well, was  able to reach behind back without pain for first time since evaluation. Education on use of new TENS unit for home with picture taken of pad placement. One more session prior to trip to [place] for continued pain control and posture correction. Patient will benefit from skilled physical therapy to reduce pain, improve strength, and return patient to prior level of function    Personal Factors and  Comorbidities Comorbidity 3+;Profession;Age;Time since onset of injury/illness/exacerbation;Past/Current Experience    Comorbidities DM type II, disc disease degenerative, HLD, HTN, PSVT, RAD    Examination-Activity Limitations Bed Mobility;Caring for Others;Carry;Dressing;Hygiene/Grooming;Lift;Reach Overhead;Sleep    Examination-Participation Restrictions Cleaning;Community Activity;Driving;Laundry;Volunteer;Shop;Meal Prep;Yard Work;Other    Stability/Clinical Decision Making Evolving/Moderate complexity    Rehab Potential Good    PT Frequency 2x / week    PT Duration 6 weeks    PT Treatment/Interventions Cryotherapy;Electrical Stimulation;Iontophoresis 4mg /ml Dexamethasone;Moist Heat;Therapeutic exercise;Therapeutic activities;Ultrasound;Neuromuscular re-education;Patient/family education;Manual techniques;Passive range of motion;Compression bandaging;Dry needling;Energy conservation;Visual/perceptual remediation/compensation;Vasopneumatic Device;Taping;Joint Manipulations    PT Next Visit Plan likes estim so try that, reduce upper trap tightness, posture, isometrics    PT Home Exercise Plan see in note    Consulted and Agree with Plan of Care Patient           Patient will benefit from skilled therapeutic intervention in order to improve the following deficits and impairments:  Decreased activity tolerance, Decreased endurance, Decreased mobility, Decreased range of motion, Decreased strength, Impaired flexibility, Impaired UE functional use, Improper body mechanics, Pain, Postural dysfunction  Visit  Diagnosis: Chronic left shoulder pain  Abnormal posture     Problem List Patient Active Problem List   Diagnosis Date Noted  . Hyperlipidemia 10/11/2018  . Overweight (BMI 25.0-29.9) 10/11/2018  . Type 2 diabetes mellitus with hyperglycemia, without long-term current use of insulin (HCC) 06/14/2018  . Abdominal pain, right upper quadrant 05/29/2018  . Biliary dyskinesia 05/29/2018   07/30/2018, PT, DPT   05/10/2020, 9:45 AM  [facility] MAIN [facility] SERVICES [address] [place], [place], [place] Phone: [phone]   Fax:  [phone]  Name: Kim Lowery MRN: [id number] Riggs Date of Birth: [birth year]

## 2020-05-14 ENCOUNTER — Ambulatory Visit: Payer: Managed Care, Other (non HMO)

## 2020-05-26 NOTE — Therapy (Signed)
[facility] MAIN REHAB SERVICES [address] [place], [place], [address] Phone: [phone]   Fax:  [phone]  May 26, 2020   No Recipients  Physical Therapy Discharge Summary  Patient: Jourden Stemen  MRN: [id number]  Date of Birth: 10/26/1951   Diagnosis: No diagnosis found. Referring Provider (PT): Poggi, John MD   The above patient had been seen in Physical Therapy 4 times of 4 treatments scheduled with 0 no shows and 0 cancellations.  The treatment consisted of manual and therex The patient is: Improved  Subjective: Patient called and self discharged due to no longer needing therapy to address deficits.   Discharge Findings: Patient not physically present for discharge.   Functional Status at Discharge: Returned to PLOF no pain.   All Goals Met    Sincerely,   Marina Moser, PT, DPT    CC No Recipients   [facility] MAIN REHAB SERVICES [address] , [place], [address] Phone: [phone]   Fax:  [phone]  Patient: Judeen Overholt  MRN: [id number]  Date of Birth: 04/16/1951    

## 2020-05-28 ENCOUNTER — Ambulatory Visit: Payer: Managed Care, Other (non HMO)

## 2020-07-06 ENCOUNTER — Ambulatory Visit: Payer: Managed Care, Other (non HMO)

## 2020-07-08 ENCOUNTER — Ambulatory Visit: Payer: Managed Care, Other (non HMO)

## 2020-09-14 ENCOUNTER — Emergency Department
Admission: EM | Admit: 2020-09-14 | Discharge: 2020-09-14 | Disposition: A | Discharge status: Home / Self Care | Payer: Managed Care, Other (non HMO) | Attending: Emergency Medicine | Admitting: Emergency Medicine

## 2020-09-14 ENCOUNTER — Emergency Department: Payer: Managed Care, Other (non HMO)

## 2020-09-14 ENCOUNTER — Other Ambulatory Visit: Payer: Self-pay

## 2020-09-14 ENCOUNTER — Encounter: Payer: Self-pay | Admitting: *Deleted

## 2020-09-14 DIAGNOSIS — Z20822 Contact with and (suspected) exposure to covid-19: Secondary | ICD-10-CM | POA: Insufficient documentation

## 2020-09-14 DIAGNOSIS — M791 Myalgia, unspecified site: Secondary | ICD-10-CM | POA: Insufficient documentation

## 2020-09-14 DIAGNOSIS — R059 Cough, unspecified: Secondary | ICD-10-CM | POA: Diagnosis present

## 2020-09-14 DIAGNOSIS — Z5321 Procedure and treatment not carried out due to patient leaving prior to being seen by health care provider: Secondary | ICD-10-CM | POA: Diagnosis not present

## 2020-09-14 DIAGNOSIS — R0602 Shortness of breath: Secondary | ICD-10-CM | POA: Diagnosis not present

## 2020-09-14 LAB — RESPIRATORY PANEL BY RT PCR (FLU A&B, COVID)
Influenza A by PCR: NEGATIVE
Influenza B by PCR: NEGATIVE
SARS Coronavirus 2 by RT PCR: NEGATIVE

## 2020-09-14 NOTE — ED Triage Notes (Signed)
Pt to ED with cough and generalized body aches and intermittent SOB. Pt requesting COVID testing after a recent trip [place]. No fevers at this time.

## 2020-12-09 ENCOUNTER — Other Ambulatory Visit (HOSPITAL_COMMUNITY): Payer: Self-pay | Admitting: Gastroenterology

## 2020-12-09 ENCOUNTER — Other Ambulatory Visit: Payer: Self-pay | Admitting: Gastroenterology

## 2020-12-09 DIAGNOSIS — R1011 Right upper quadrant pain: Secondary | ICD-10-CM

## 2020-12-21 ENCOUNTER — Ambulatory Visit: Payer: BC Managed Care – PPO

## 2020-12-23 ENCOUNTER — Other Ambulatory Visit: Payer: Self-pay | Admitting: Internal Medicine

## 2020-12-23 DIAGNOSIS — R1013 Epigastric pain: Secondary | ICD-10-CM

## 2020-12-28 ENCOUNTER — Ambulatory Visit (INDEPENDENT_AMBULATORY_CARE_PROVIDER_SITE_OTHER): Payer: BC Managed Care – PPO

## 2020-12-28 ENCOUNTER — Other Ambulatory Visit: Payer: Self-pay

## 2020-12-28 ENCOUNTER — Ambulatory Visit (INDEPENDENT_AMBULATORY_CARE_PROVIDER_SITE_OTHER): Payer: BC Managed Care – PPO | Admitting: Podiatry

## 2020-12-28 DIAGNOSIS — M722 Plantar fascial fibromatosis: Secondary | ICD-10-CM

## 2020-12-28 MED ORDER — BETAMETHASONE SOD PHOS & ACET 6 (3-3) MG/ML IJ SUSP
3.0000 mg | Freq: Once | INTRAMUSCULAR | Status: AC
  Administered 2020-12-28: 3 mg via INTRA_ARTICULAR

## 2020-12-28 NOTE — Progress Notes (Addendum)
   Subjective: 70 y.o. female presenting today as a new patient for evaluation of right heel pain to the lateral aspect.  Patient states is been present for a few months now.  She denies a history of injury.  She walks with her husband approximately 5 to 6 miles per day.  She does admit to walking around the house barefoot most of the day.  She presents for further treatment and evaluation   Past Medical History:  Diagnosis Date  . Allergy   . Diabetes mellitus without complication (HCC) 2009?   diet controlled  . Hx of pancreatitis 2019  . Hypertension      Objective: Physical Exam General: The patient is alert and oriented x3 in no acute distress.  Dermatology: Skin is warm, dry and supple bilateral lower extremities. Negative for open lesions or macerations bilateral.   Vascular: Dorsalis Pedis and Posterior Tibial pulses palpable bilateral.  Capillary fill time is immediate to all digits.  Neurological: Epicritic and protective threshold intact bilateral.   Musculoskeletal: Tenderness to palpation to the plantar aspect of the right heel along the plantar fascia. All other joints range of motion within normal limits bilateral. Strength 5/5 in all groups bilateral.   Radiographic exam: Normal osseous mineralization. Joint spaces preserved. No fracture/dislocation/boney destruction. No other soft tissue abnormalities or radiopaque foreign bodies.   Assessment: 1. Plantar fasciitis right  Plan of Care:  1. Patient evaluated. Xrays reviewed.   2. Injection of 0.5cc Celestone soluspan injected into the right plantar fascia  3. Rx for Medrol Dose Pack placed 4.  Recommend OTC NSAIDs as needed 5.  Recommend arch supports at [employer] running store if symptoms do not improve.  Otherwise continue new balance running shoes 6. Instructed patient regarding therapies and modalities at home to alleviate symptoms.  7. Return to clinic in 4 weeks.    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 No pain when she wrote the card   Felecia Shelling, DPM [facility]  Dr. Felecia Shelling, DPM    [address] [place], [place] [address]                Office [phone]  Fax [phone]

## 2021-01-03 ENCOUNTER — Ambulatory Visit
Admission: RE | Admit: 2021-01-03 | Discharge: 2021-01-03 | Disposition: A | Discharge status: Home / Self Care | Payer: BC Managed Care – PPO | Source: Ambulatory Visit | Attending: Internal Medicine | Admitting: Internal Medicine

## 2021-01-03 ENCOUNTER — Other Ambulatory Visit: Payer: Self-pay

## 2021-01-03 DIAGNOSIS — R1013 Epigastric pain: Secondary | ICD-10-CM

## 2021-01-10 ENCOUNTER — Telehealth: Payer: Self-pay | Admitting: Podiatry

## 2021-01-10 NOTE — Telephone Encounter (Signed)
Patients husband called in on behalf of wife, requesting prescription for medrol dose pak, stated it was previously prescribed and patient declined at visit but was told the meds could be called in at later date. Please Advise

## 2021-01-25 ENCOUNTER — Other Ambulatory Visit: Payer: Self-pay

## 2021-01-25 ENCOUNTER — Ambulatory Visit (INDEPENDENT_AMBULATORY_CARE_PROVIDER_SITE_OTHER): Payer: BC Managed Care – PPO | Admitting: Podiatry

## 2021-01-25 DIAGNOSIS — M722 Plantar fascial fibromatosis: Secondary | ICD-10-CM | POA: Diagnosis not present

## 2021-01-25 MED ORDER — METHYLPREDNISOLONE 4 MG PO TBPK: ORAL_TABLET | ORAL | 0 refills | Status: DC

## 2021-01-25 NOTE — Progress Notes (Signed)
   Subjective: 70 y.o. female presenting today for follow-up evaluation of right heel pain to the lateral aspect.  Patient states is been present for a few months now.  She denies a history of injury.  She walks with her husband approximately 5 to 6 miles per day.  She does admit to walking around the house barefoot most of the day.    Patient states that the injection she received last visit helped significantly only for approximately 3-4 days.  She did not take the prednisone pack that was prescribed last visit.  She presents for further treatment and evaluation   Past Medical History:  Diagnosis Date  . Allergy   . Diabetes mellitus without complication (HCC) 2009?   diet controlled  . Hx of pancreatitis 2019  . Hypertension      Objective: Physical Exam General: The patient is alert and oriented x3 in no acute distress.  Dermatology: Skin is warm, dry and supple bilateral lower extremities. Negative for open lesions or macerations bilateral.   Vascular: Dorsalis Pedis and Posterior Tibial pulses palpable bilateral.  Capillary fill time is immediate to all digits.  Neurological: Epicritic and protective threshold intact bilateral.   Musculoskeletal: Today it seems that the maximum point of tenderness is at the insertion of the Achilles tendon onto the posterior tubercle of the calcaneus.  All other joints range of motion within normal limits bilateral. Strength 5/5 in all groups bilateral.   Assessment: 1.  Insertional Achilles tendinitis right  Plan of Care:  1. Patient evaluated.  2.  Today we will forego any injections 3. Rx for Medrol Dose Pack placed.  Patient did not receive the prednisone pack that was prescribed last visit 4.  Recommend OTC NSAIDs as needed 5.  OTC power step insoles provided for the patient 6. Instructed patient regarding therapies and modalities at home to alleviate symptoms.  Recommend daily stretching exercises 7. 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 No pain when she wrote the card   Felecia Shelling, DPM [facility]  Dr. Felecia Shelling, DPM    [address] [place], [place] [address]                Office [phone]  Fax [phone]

## 2021-01-27 ENCOUNTER — Emergency Department: Payer: BC Managed Care – PPO

## 2021-01-27 ENCOUNTER — Other Ambulatory Visit: Payer: Self-pay

## 2021-01-27 ENCOUNTER — Encounter: Payer: Self-pay | Admitting: Emergency Medicine

## 2021-01-27 ENCOUNTER — Emergency Department
Admission: EM | Admit: 2021-01-27 | Discharge: 2021-01-27 | Disposition: A | Discharge status: Home / Self Care | Payer: BC Managed Care – PPO | Attending: Emergency Medicine | Admitting: Emergency Medicine

## 2021-01-27 DIAGNOSIS — I10 Essential (primary) hypertension: Secondary | ICD-10-CM | POA: Insufficient documentation

## 2021-01-27 DIAGNOSIS — E1165 Type 2 diabetes mellitus with hyperglycemia: Secondary | ICD-10-CM | POA: Insufficient documentation

## 2021-01-27 DIAGNOSIS — Z96693 Finger-joint replacement, bilateral: Secondary | ICD-10-CM | POA: Diagnosis not present

## 2021-01-27 DIAGNOSIS — Z7982 Long term (current) use of aspirin: Secondary | ICD-10-CM | POA: Insufficient documentation

## 2021-01-27 DIAGNOSIS — R1010 Upper abdominal pain, unspecified: Secondary | ICD-10-CM | POA: Diagnosis present

## 2021-01-27 DIAGNOSIS — K5900 Constipation, unspecified: Secondary | ICD-10-CM

## 2021-01-27 DIAGNOSIS — Z79899 Other long term (current) drug therapy: Secondary | ICD-10-CM | POA: Insufficient documentation

## 2021-01-27 DIAGNOSIS — R1012 Left upper quadrant pain: Secondary | ICD-10-CM

## 2021-01-27 LAB — COMPREHENSIVE METABOLIC PANEL
ALT: 17 U/L (ref 0–44)
AST: 22 U/L (ref 15–41)
Albumin: 4.4 g/dL (ref 3.5–5.0)
Alkaline Phosphatase: 56 U/L (ref 38–126)
Anion gap: 9 (ref 5–15)
BUN: 12 mg/dL (ref 8–23)
CO2: 24 mmol/L (ref 22–32)
Calcium: 9.1 mg/dL (ref 8.9–10.3)
Chloride: 107 mmol/L (ref 98–111)
Creatinine, Ser: 0.75 mg/dL (ref 0.44–1.00)
GFR, Estimated: >60 mL/min (ref >60)
Glucose, Bld: 126 mg/dL — ABNORMAL HIGH (ref 70–99)
Potassium: 3.4 mmol/L — ABNORMAL LOW (ref 3.5–5.1)
Sodium: 140 mmol/L (ref 135–145)
Total Bilirubin: 0.5 mg/dL (ref 0.3–1.2)
Total Protein: 7.6 g/dL (ref 6.5–8.1)

## 2021-01-27 LAB — URINALYSIS, COMPLETE (UACMP) WITH MICROSCOPIC
Bilirubin Urine: NEGATIVE
Glucose, UA: NEGATIVE mg/dL
Hgb urine dipstick: NEGATIVE
Ketones, ur: NEGATIVE mg/dL
Leukocytes,Ua: NEGATIVE
Nitrite: NEGATIVE
Protein, ur: NEGATIVE mg/dL
Specific Gravity, Urine: 1.021 (ref 1.005–1.030)
pH: 7 (ref 5.0–8.0)

## 2021-01-27 LAB — CBC
HCT: 38.4 % (ref 36.0–46.0)
Hemoglobin: 12.4 g/dL (ref 12.0–15.0)
MCH: 29.7 pg (ref 26.0–34.0)
MCHC: 32.3 g/dL (ref 30.0–36.0)
MCV: 91.9 fL (ref 80.0–100.0)
Platelets: 207 10*3/uL (ref 150–400)
RBC: 4.18 MIL/uL (ref 3.87–5.11)
RDW: 13.8 % (ref 11.5–15.5)
WBC: 5.5 10*3/uL (ref 4.0–10.5)
nRBC: 0 % (ref 0.0–0.2)

## 2021-01-27 LAB — LIPASE, BLOOD: Lipase: 41 U/L (ref 11–51)

## 2021-01-27 MED ORDER — SODIUM CHLORIDE 0.9 % IV BOLUS
1000.0000 mL | Freq: Once | INTRAVENOUS | Status: AC
  Administered 2021-01-27: 1000 mL via INTRAVENOUS

## 2021-01-27 MED ORDER — ONDANSETRON HCL 4 MG/2ML IJ SOLN
4.0000 mg | Freq: Once | INTRAMUSCULAR | Status: AC
  Administered 2021-01-27: 4 mg via INTRAVENOUS
  Filled 2021-01-27: qty 2

## 2021-01-27 MED ORDER — ONDANSETRON 4 MG PO TBDP: 4.0000 mg | ORAL_TABLET | Freq: Three times a day (TID) | ORAL | 0 refills | Status: DC | PRN

## 2021-01-27 MED ORDER — MORPHINE SULFATE (PF) 4 MG/ML IV SOLN
4.0000 mg | Freq: Once | INTRAVENOUS | Status: AC
  Administered 2021-01-27: 4 mg via INTRAVENOUS
  Filled 2021-01-27: qty 1

## 2021-01-27 MED ORDER — IOHEXOL 300 MG/ML  SOLN
100.0000 mL | Freq: Once | INTRAMUSCULAR | Status: AC | PRN
  Administered 2021-01-27: 100 mL via INTRAVENOUS

## 2021-01-27 NOTE — ED Provider Notes (Signed)
[facility] Emergency Department Provider Note  ____________________________________________   Event Date/Time   First MD Initiated Contact with Patient 01/27/21 1726     (approximate)  I have reviewed the triage vital signs and the nursing notes.   HISTORY  Chief Complaint Abdominal Pain    HPI Kim Lowery is a 70 y.o. female with diabetes now diet controlled and history of pancreatitis who comes in with upper abdominal pain.  Patient reports having multiple admissions for pancreatitis.  She has been worked up by surgery for consideration of having her gallbladder removed.  She last had a CT scan 6 months ago that showed pancreatitis and they recommended MRCP if clinically necessary.  Patient is planned to follow-up with GI next week but states that she came in today for worsening upper abdominal pain mostly in the left upper quadrant, constant, not better with her home pain medicine, worse with eating.  It is associated with some nausea as well.  States that this is usually what she gets when she feels like she needs to be admitted.          Past Medical History:  Diagnosis Date  . Allergy   . Diabetes mellitus without complication (HCC) 2009?   diet controlled  . Hx of pancreatitis 2019  . Hypertension     Patient Active Problem List   Diagnosis Date Noted  . Hyperlipidemia 10/11/2018  . Overweight (BMI 25.0-29.9) 10/11/2018  . Type 2 diabetes mellitus with hyperglycemia, without long-term current use of insulin (HCC) 06/14/2018  . Abdominal pain, right upper quadrant 05/29/2018  . Biliary dyskinesia 05/29/2018    Past Surgical History:  Procedure Laterality Date  . ABDOMINAL HYSTERECTOMY  1996   Total  . BREAST BIOPSY Left 1972, 1999  . CESAREAN SECTION  1988  . COLONOSCOPY  2018  . HAND ARTHROPLASTY Bilateral 2014    Prior to Admission medications   Medication Sig Start Date End Date Taking? Authorizing Provider  aspirin EC 81 MG  tablet Take 81 mg by mouth daily.    [provider]  Cyanocobalamin (B-12 PO) Take by mouth.    [provider]  estradiol (CLIMARA) 0.1 mg/24hr patch Place 1 patch onto the skin once a week. 09/12/12   [provider]  losartan-hydrochlorothiazide (HYZAAR) 50-12.5 MG tablet Hyzaar 50 mg-12.5 mg tablet 11/17/19   [provider]  methylPREDNISolone (MEDROL DOSEPAK) 4 MG TBPK tablet 6 day dose pack - take as directed 01/25/21   Felecia Shelling, DPM  SINGULAIR 10 MG tablet Take 10 mg by mouth daily. 02/12/20   [provider]  Vitamin D, Ergocalciferol, (DRISDOL) 50000 units CAPS capsule  03/22/18   [provider]    Allergies Avelox [moxifloxacin hcl in nacl], Other, Fluticasone furoate-vilanterol, Macrobid [nitrofurantoin macrocrystal], Moxifloxacin, Pitavastatin, and Sulfa antibiotics  Family History  Problem Relation Age of Onset  . Diabetes Mother   . Diabetes Father   . Gastric cancer Brother     Social History Social History   Tobacco Use  . Smoking status: Never Smoker  . Smokeless tobacco: Never Used  Vaping Use  . Vaping Use: Never used  Substance Use Topics  . Alcohol use: Yes    Comment: wine occasionally  . Drug use: No      Review of Systems Constitutional: No fever/chills Eyes: No visual changes. ENT: No sore throat. Cardiovascular: Denies chest pain. Respiratory: Denies shortness of breath. Gastrointestinal: Abdominal pain, nausea Genitourinary: Negative for dysuria. Musculoskeletal: Negative  for back pain. Skin: Negative for rash. Neurological: Negative for headaches, focal weakness or numbness. All other ROS negative ____________________________________________   PHYSICAL EXAM:  VITAL SIGNS: ED Triage Vitals  Enc Vitals Group     BP 01/27/21 1711 (!) 181/76     Pulse Rate 01/27/21 1711 (!) 56     Resp 01/27/21 1711 18     Temp 01/27/21 1711 98 F (36.7 C)     Temp Source 01/27/21 1711 Oral      SpO2 01/27/21 1711 100 %     Weight 01/27/21 1712 137 lb (62.1 kg)     Height 01/27/21 1712 5\' 3"  (1.6 m)     Head Circumference --      Peak Flow --      Pain Score 01/27/21 1712 9     Pain Loc --      Pain Edu? --      Excl. in GC? --     Constitutional: Alert and oriented. Well appearing and in no acute distress. Eyes: Conjunctivae are normal. EOMI. Head: Atraumatic. Nose: No congestion/rhinnorhea. Mouth/Throat: Mucous membranes are moist.   Neck: No stridor. Trachea Midline. FROM Cardiovascular: Normal rate, regular rhythm. Grossly normal heart sounds.  Good peripheral circulation. Respiratory: Normal respiratory effort.  No retractions. Lungs CTAB. Gastrointestinal: Left upper quadrant tenderness no distention. No abdominal bruits.  Musculoskeletal: No lower extremity tenderness nor edema.  No joint effusions. Neurologic:  Normal speech and language. No gross focal neurologic deficits are appreciated.  Skin:  Skin is warm, dry and intact. No rash noted. Psychiatric: Mood and affect are normal. Speech and behavior are normal. GU: Deferred   ____________________________________________   LABS (all labs ordered are listed, but only abnormal results are displayed)  Labs Reviewed  COMPREHENSIVE METABOLIC PANEL - Abnormal; Notable for the following components:      Result Value   Potassium 3.4 (*)    Glucose, Bld 126 (*)    All other components within normal limits  URINALYSIS, COMPLETE (UACMP) WITH MICROSCOPIC - Abnormal; Notable for the following components:   Color, Urine YELLOW (*)    APPearance HAZY (*)    Bacteria, UA RARE (*)    All other components within normal limits  LIPASE, BLOOD  CBC   ____________________________________________   RADIOLOGY   Official radiology report(s): CT ABDOMEN PELVIS W CONTRAST  Result Date: 01/27/2021 CLINICAL DATA:  Epigastric and left upper quadrant pain, history of pancreatitis EXAM: CT ABDOMEN AND PELVIS WITH CONTRAST  TECHNIQUE: Multidetector CT imaging of the abdomen and pelvis was performed using the standard protocol following bolus administration of intravenous contrast. CONTRAST:  03/29/2021 OMNIPAQUE IOHEXOL 300 MG/ML  SOLN COMPARISON:  01/03/2021, 08/14/2017 FINDINGS: Lower chest: No acute pleural or parenchymal lung disease. Hepatobiliary: Mild diffuse hepatic steatosis, with stable hepatic cysts. No biliary dilation. The gallbladder is unremarkable. Pancreas: Unremarkable. No pancreatic ductal dilatation or surrounding inflammatory changes. Spleen: Normal in size without focal abnormality. Adrenals/Urinary Tract: Adrenal glands are unremarkable. Kidneys are normal, without renal calculi, focal lesion, or hydronephrosis. Bladder is unremarkable. Stomach/Bowel: No bowel obstruction or ileus. Normal appendix within the lower central pelvis. No bowel wall thickening or inflammatory change. Significant fecal retention throughout the colon. Vascular/Lymphatic: Aortic atherosclerosis. No enlarged abdominal or pelvic lymph nodes. Reproductive: Status post hysterectomy. No adnexal masses. Other: No free fluid or free gas.  No abdominal wall hernia. Musculoskeletal: No acute or destructive bony lesions. Reconstructed images demonstrate no additional findings. IMPRESSION: 1. No bowel obstruction or ileus. Significant fecal  retention throughout the colon. 2. Normal appendix. 3. No evidence of acute pancreatitis. 4. Mild hepatic steatosis. 5.  Aortic Atherosclerosis (ICD10-I70.0). Electronically Signed   By: Sharlet Salina M.D.   On: 01/27/2021 18:54    ____________________________________________   PROCEDURES  Procedure(s) performed (including Critical Care):  Procedures   ____________________________________________   INITIAL IMPRESSION / ASSESSMENT AND PLAN / ED COURSE  Iria Jamerson was evaluated in Emergency Department on 01/27/2021 for the symptoms described in the history of present illness. She was evaluated in the  context of the global COVID-19 pandemic, which necessitated consideration that the patient might be at risk for infection with the SARS-CoV-2 virus that causes COVID-19. Institutional protocols and algorithms that pertain to the evaluation of patients at risk for COVID-19 are in a state of rapid change based on information released by regulatory bodies including the CDC and federal and state organizations. These policies and algorithms were followed during the patient's care in the ED.    Patient is a 70 year old who comes in slightly hypertensive with left upper quadrant tenderness in the setting of recurrent pancreatitis.  Her lipase was normal but I suspect that given her multiple pancreatitis events that she may not be able to mount a response.  Will get CT scan to rule out pancreatitis evaluate for any complications such as necrosis, cyst.  Patient's had prior gallbladder imaging as shown but is not functioning well but has no stones so I have low suspicion for cholecystitis.  She may need inpatient work-up and consultation with surgery to decide if it needs to be removed.  We will give patient some IV Zofran, morphine and fluids  Labs are reassuring.  UA without evidence of UTI.  Potassium slightly low at 3.4 but will hold off completion due to patient's nausea abdominal pain nothing that would just make things worse.  CT scan is negative for pancreatitis.  Does have significant fecal retention.  I did discuss this with patient who states that she did not have a stool today but did have one yesterday.  We discussed bowel regimen, Tylenol ibuprofen and Zofran to help with symptomatic management.  Patient will follow up with GI as scheduled next week.  Patient expressed understanding felt comfortable with this plan and comfortable with discharge home.  She understands that if her symptoms are worsening she can return to the ER for repeat evaluation        ____________________________________________   FINAL CLINICAL IMPRESSION(S) / ED DIAGNOSES   Final diagnoses:  Constipation, unspecified constipation type  Left upper quadrant abdominal pain      MEDICATIONS GIVEN DURING THIS VISIT:  Medications  sodium chloride 0.9 % bolus 1,000 mL (0 mLs Intravenous Stopped 01/27/21 1938)  morphine 4 MG/ML injection 4 mg (4 mg Intravenous Given 01/27/21 1817)  ondansetron (ZOFRAN) injection 4 mg (4 mg Intravenous Given 01/27/21 1814)  iohexol (OMNIPAQUE) 300 MG/ML solution 100 mL (100 mLs Intravenous Contrast Given 01/27/21 1827)     ED Discharge Orders         Ordered    ondansetron (ZOFRAN ODT) 4 MG disintegrating tablet  Every 8 hours PRN        01/27/21 1940           Note:  This document was prepared using Dragon voice recognition software and may include unintentional dictation errors.   Concha Se, MD 01/27/21 [phone]

## 2021-01-27 NOTE — ED Triage Notes (Signed)
First RN Note: Pt to ED via POV, states "I think it's another bout of pancreatitis".

## 2021-01-27 NOTE — ED Triage Notes (Signed)
Pt comes into the ED via POV c/o LUQ abdominal pain.  Pt has h/o pancreatitis and this feels the same.  Pt states that she had an upper endoscopy done recently.  Pt states the pain has been ongoing for about a week and progressively getting worse.  Pt denies any vomiting but does have nausea.

## 2021-01-27 NOTE — Discharge Instructions (Addendum)
Tylenol 1 g every 8 hours combined with ibuprofen 600 every 6 hours to help with pain.  Do a stool regimen to help clear out your bowels and follow-up with GI as scheduled  Return to the ER for worsening pain or any other concerns  IMPRESSION:  1. No bowel obstruction or ileus. Significant fecal retention  throughout the colon.  2. Normal appendix.  3. No evidence of acute pancreatitis.  4. Mild hepatic steatosis.  5.  Aortic Atherosclerosis (ICD10-I70.0).

## 2021-01-27 NOTE — ED Notes (Signed)
Pt calm , collective , ambulatory upon discharge. Discharge instructions reviewed with pt and spouse.

## 2021-01-27 NOTE — ED Notes (Signed)
ED Provider at bedside. 

## 2021-01-27 NOTE — ED Notes (Signed)
Pt stated that her HR normally runs at mid 40's

## 2021-01-27 NOTE — ED Notes (Signed)
Back from CT

## 2021-01-29 ENCOUNTER — Emergency Department
Admission: EM | Admit: 2021-01-29 | Discharge: 2021-01-30 | Disposition: A | Discharge status: Home / Self Care | Payer: BC Managed Care – PPO | Attending: Emergency Medicine | Admitting: Emergency Medicine

## 2021-01-29 ENCOUNTER — Encounter: Payer: Self-pay | Admitting: Emergency Medicine

## 2021-01-29 DIAGNOSIS — Z7982 Long term (current) use of aspirin: Secondary | ICD-10-CM | POA: Diagnosis not present

## 2021-01-29 DIAGNOSIS — I10 Essential (primary) hypertension: Secondary | ICD-10-CM | POA: Insufficient documentation

## 2021-01-29 DIAGNOSIS — R1012 Left upper quadrant pain: Secondary | ICD-10-CM | POA: Diagnosis not present

## 2021-01-29 DIAGNOSIS — Z79899 Other long term (current) drug therapy: Secondary | ICD-10-CM | POA: Insufficient documentation

## 2021-01-29 DIAGNOSIS — R1013 Epigastric pain: Secondary | ICD-10-CM

## 2021-01-29 DIAGNOSIS — E119 Type 2 diabetes mellitus without complications: Secondary | ICD-10-CM | POA: Insufficient documentation

## 2021-01-29 DIAGNOSIS — R109 Unspecified abdominal pain: Secondary | ICD-10-CM | POA: Diagnosis present

## 2021-01-29 NOTE — ED Triage Notes (Signed)
Pt seen on 3/4 by [facility] and diagnosed with constipation due to pancreatitis. Pt reports taking linzess, ducolax, 2 fleet enemas, 7 doses Miralax without relief. Pt with distended, hardened abdomin.

## 2021-01-29 NOTE — ED Provider Notes (Signed)
[facility] Emergency Department Provider Note   ____________________________________________   Event Date/Time   First MD Initiated Contact with Patient 01/29/21 2321     (approximate)  I have reviewed the triage vital signs and the nursing notes.   HISTORY  Chief Complaint Constipation    HPI Kim Lowery is a 70 y.o. female with past medical history of hypertension, diabetes, and pancreatitis who presents to the ED complaining of abdominal pain.  Patient reports that she has had 3 to 4 days of pain in the left upper quadrant of her abdomen.  Pain is constant and sharp, comes in waves but is not exacerbated or alleviated by anything in particular.  She has been feeling nauseous and has been unable to eat or drink much at home, but denies any vomiting or diarrhea.  She has been constipated recently, was seen in the ED for the symptoms 2 days ago and had a CT scan showing significant constipation.  She has tried Dulcolax and MiraLAX since then without significant relief.  She endorses urinary frequency, but denies fevers or flank pain.  She describes current symptoms as similar to prior episodes of pancreatitis and she has GI follow-up scheduled at [facility] in 5 days.         Past Medical History:  Diagnosis Date  . Allergy   . Diabetes mellitus without complication (HCC) 2009?   diet controlled  . Hx of pancreatitis 2019  . Hypertension     Patient Active Problem List   Diagnosis Date Noted  . Hyperlipidemia 10/11/2018  . Overweight (BMI 25.0-29.9) 10/11/2018  . Type 2 diabetes mellitus with hyperglycemia, without long-term current use of insulin (HCC) 06/14/2018  . Abdominal pain, right upper quadrant 05/29/2018  . Biliary dyskinesia 05/29/2018    Past Surgical History:  Procedure Laterality Date  . ABDOMINAL HYSTERECTOMY  1996   Total  . BREAST BIOPSY Left 1972, 1999  . CESAREAN SECTION  1988  . COLONOSCOPY  2018  . HAND ARTHROPLASTY Bilateral  2014    Prior to Admission medications   Medication Sig Start Date End Date Taking? Authorizing Provider  lidocaine (XYLOCAINE) 2 % solution Use as directed 15 mLs in the mouth or throat every 4 (four) hours as needed for mouth pain. 01/30/21  Yes Chesley Noon, MD  aspirin EC 81 MG tablet Take 81 mg by mouth daily.    [provider]  Cyanocobalamin (B-12 PO) Take by mouth.    [provider]  estradiol (CLIMARA) 0.1 mg/24hr patch Place 1 patch onto the skin once a week. 09/12/12   [provider]  losartan-hydrochlorothiazide (HYZAAR) 50-12.5 MG tablet Hyzaar 50 mg-12.5 mg tablet 11/17/19   [provider]  methylPREDNISolone (MEDROL DOSEPAK) 4 MG TBPK tablet 6 day dose pack - take as directed 01/25/21   Felecia Shelling, DPM  ondansetron (ZOFRAN ODT) 4 MG disintegrating tablet Take 1 tablet (4 mg total) by mouth every 8 (eight) hours as needed for nausea or vomiting. 01/27/21   Concha Se, MD  SINGULAIR 10 MG tablet Take 10 mg by mouth daily. 02/12/20   [provider]  Vitamin D, Ergocalciferol, (DRISDOL) 50000 units CAPS capsule  03/22/18   [provider]    Allergies Avelox [moxifloxacin hcl in nacl], Other, Fluticasone furoate-vilanterol, Macrobid [nitrofurantoin macrocrystal], Moxifloxacin, Pitavastatin, and Sulfa antibiotics  Family History  Problem Relation Age of Onset  . Diabetes Mother   . Diabetes Father   . Gastric cancer Brother  Social History Social History   Tobacco Use  . Smoking status: Never Smoker  . Smokeless tobacco: Never Used  Vaping Use  . Vaping Use: Never used  Substance Use Topics  . Alcohol use: Yes    Comment: wine occasionally  . Drug use: No    Review of Systems  Constitutional: No fever/chills Eyes: No visual changes. ENT: No sore throat. Cardiovascular: Denies chest pain. Respiratory: Denies shortness of breath. Gastrointestinal: Positive for abdominal pain and nausea, no vomiting.   No diarrhea.  No constipation. Genitourinary: Negative for dysuria. Musculoskeletal: Negative for back pain. Skin: Negative for rash. Neurological: Negative for headaches, focal weakness or numbness.  ____________________________________________   PHYSICAL EXAM:  VITAL SIGNS: ED Triage Vitals [01/29/21 2102]  Enc Vitals Group     BP (!) 199/73     Pulse Rate 64     Resp 17     Temp 97.8 F (36.6 C)     Temp Source Oral     SpO2 99 %     Weight      Height      Head Circumference      Peak Flow      Pain Score      Pain Loc      Pain Edu?      Excl. in GC?     Constitutional: Alert and oriented. Eyes: Conjunctivae are normal. Head: Atraumatic. Nose: No congestion/rhinnorhea. Mouth/Throat: Mucous membranes are moist. Neck: Normal ROM Cardiovascular: Normal rate, regular rhythm. Grossly normal heart sounds. Respiratory: Normal respiratory effort.  No retractions. Lungs CTAB. Gastrointestinal: Soft and tender to palpation in the left upper quadrant with no rebound or guarding. No distention. Genitourinary: deferred Musculoskeletal: No lower extremity tenderness nor edema. Neurologic:  Normal speech and language. No gross focal neurologic deficits are appreciated. Skin:  Skin is warm, dry and intact. No rash noted. Psychiatric: Mood and affect are normal. Speech and behavior are normal.  ____________________________________________   LABS (all labs ordered are listed, but only abnormal results are displayed)  Labs Reviewed  COMPREHENSIVE METABOLIC PANEL - Abnormal; Notable for the following components:      Result Value   Glucose, Bld 102 (*)    All other components within normal limits  URINALYSIS, COMPLETE (UACMP) WITH MICROSCOPIC - Abnormal; Notable for the following components:   Color, Urine STRAW (*)    APPearance CLEAR (*)    Ketones, ur 20 (*)    Bacteria, UA RARE (*)    All other components within normal limits  CBC WITH DIFFERENTIAL/PLATELET   LIPASE, BLOOD   ____________________________________________   PROCEDURES  Procedure(s) performed (including Critical Care):  Procedures   ____________________________________________   INITIAL IMPRESSION / ASSESSMENT AND PLAN / ED COURSE       71 year old female with past medical history of hypertension, diabetes, and pancreatitis who presents to the ED with 3 to 4 days of left upper quadrant abdominal pain associated with nausea and poor p.o. intake.  Patient has tenderness to palpation left upper quadrant of her abdomen.  Prior labs and imaging were reviewed from ED visit 2 days ago, when CT scan was negative for acute process but did show significant constipation.  We will repeat CT today to ensure she is not developing a bowel obstruction.  We will also repeat labs including LFTs and lipase.  We will treat symptomatically with IV morphine and Zofran, hydrate with IV fluids.  Differential includes pancreatitis, cholecystitis, biliary colic, gastritis, peptic ulcer disease.  CT scan  is unremarkable, lab work is also reassuring, LFTs and lipase within normal limits.  At this point, patient symptoms seem most consistent with gastritis or peptic ulcer disease.  She has GI follow-up scheduled later this week at [facility] and may benefit from endoscopy.  She does take a PPI and I have counseled her to increase this dose to twice daily.  She was also given instructions on bowel regimen for constipation.  She was treated with GI cocktail with partial relief of symptoms and we will prescribe viscous lidocaine.  She is appropriate for discharge home with PCP and GI follow-up, was counseled to return to the ED for new or worsening symptoms.      ____________________________________________   FINAL CLINICAL IMPRESSION(S) / ED DIAGNOSES  Final diagnoses:  Epigastric pain     ED Discharge Orders         Ordered    lidocaine (XYLOCAINE) 2 % solution  Every 4 hours PRN        01/30/21 0412            Note:  This document was prepared using Dragon voice recognition software and may include unintentional dictation errors.   Chesley Noon, MD 01/30/21 [phone]

## 2021-01-30 ENCOUNTER — Emergency Department: Payer: BC Managed Care – PPO

## 2021-01-30 LAB — URINALYSIS, COMPLETE (UACMP) WITH MICROSCOPIC
Bilirubin Urine: NEGATIVE
Glucose, UA: NEGATIVE mg/dL
Hgb urine dipstick: NEGATIVE
Ketones, ur: 20 mg/dL — AB
Leukocytes,Ua: NEGATIVE
Nitrite: NEGATIVE
Protein, ur: NEGATIVE mg/dL
Specific Gravity, Urine: 1.006 (ref 1.005–1.030)
pH: 6 (ref 5.0–8.0)

## 2021-01-30 LAB — CBC WITH DIFFERENTIAL/PLATELET
Abs Immature Granulocytes: 0.01 10*3/uL (ref 0.00–0.07)
Basophils Absolute: 0.1 10*3/uL (ref 0.0–0.1)
Basophils Relative: 1 %
Eosinophils Absolute: 0 10*3/uL (ref 0.0–0.5)
Eosinophils Relative: 1 %
HCT: 37.2 % (ref 36.0–46.0)
Hemoglobin: 12 g/dL (ref 12.0–15.0)
Immature Granulocytes: 0 %
Lymphocytes Relative: 39 %
Lymphs Abs: 2.1 10*3/uL (ref 0.7–4.0)
MCH: 29.7 pg (ref 26.0–34.0)
MCHC: 32.3 g/dL (ref 30.0–36.0)
MCV: 92.1 fL (ref 80.0–100.0)
Monocytes Absolute: 0.4 10*3/uL (ref 0.1–1.0)
Monocytes Relative: 7 %
Neutro Abs: 2.9 10*3/uL (ref 1.7–7.7)
Neutrophils Relative %: 52 %
Platelets: 205 10*3/uL (ref 150–400)
RBC: 4.04 MIL/uL (ref 3.87–5.11)
RDW: 13.7 % (ref 11.5–15.5)
WBC: 5.4 10*3/uL (ref 4.0–10.5)
nRBC: 0 % (ref 0.0–0.2)

## 2021-01-30 LAB — COMPREHENSIVE METABOLIC PANEL
ALT: 16 U/L (ref 0–44)
AST: 18 U/L (ref 15–41)
Albumin: 4.3 g/dL (ref 3.5–5.0)
Alkaline Phosphatase: 54 U/L (ref 38–126)
Anion gap: 10 (ref 5–15)
BUN: 9 mg/dL (ref 8–23)
CO2: 27 mmol/L (ref 22–32)
Calcium: 9.5 mg/dL (ref 8.9–10.3)
Chloride: 103 mmol/L (ref 98–111)
Creatinine, Ser: 0.68 mg/dL (ref 0.44–1.00)
GFR, Estimated: >60 mL/min (ref >60)
Glucose, Bld: 102 mg/dL — ABNORMAL HIGH (ref 70–99)
Potassium: 3.7 mmol/L (ref 3.5–5.1)
Sodium: 140 mmol/L (ref 135–145)
Total Bilirubin: 0.9 mg/dL (ref 0.3–1.2)
Total Protein: 7.3 g/dL (ref 6.5–8.1)

## 2021-01-30 LAB — LIPASE, BLOOD: Lipase: 31 U/L (ref 11–51)

## 2021-01-30 MED ORDER — LIDOCAINE VISCOUS HCL 2 % MT SOLN
15.0000 mL | OROMUCOSAL | 0 refills | Status: DC | PRN
Start: 2021-01-30 — End: 2022-09-20

## 2021-01-30 MED ORDER — MORPHINE SULFATE (PF) 4 MG/ML IV SOLN
4.0000 mg | Freq: Once | INTRAVENOUS | Status: AC
  Administered 2021-01-30: 4 mg via INTRAVENOUS
  Filled 2021-01-30: qty 1

## 2021-01-30 MED ORDER — LACTATED RINGERS IV BOLUS
1000.0000 mL | Freq: Once | INTRAVENOUS | Status: AC
  Administered 2021-01-30: 1000 mL via INTRAVENOUS

## 2021-01-30 MED ORDER — LIDOCAINE VISCOUS HCL 2 % MT SOLN
15.0000 mL | Freq: Once | OROMUCOSAL | Status: AC
  Administered 2021-01-30: 15 mL via ORAL
  Filled 2021-01-30: qty 15

## 2021-01-30 MED ORDER — ALUM & MAG HYDROXIDE-SIMETH 200-200-20 MG/5ML PO SUSP
15.0000 mL | Freq: Once | ORAL | Status: AC
  Administered 2021-01-30: 15 mL via ORAL
  Filled 2021-01-30: qty 30

## 2021-01-30 MED ORDER — ONDANSETRON HCL 4 MG/2ML IJ SOLN
4.0000 mg | Freq: Once | INTRAMUSCULAR | Status: AC
  Administered 2021-01-30: 4 mg via INTRAVENOUS
  Filled 2021-01-30: qty 2

## 2021-01-30 MED ORDER — IOHEXOL 300 MG/ML  SOLN
100.0000 mL | Freq: Once | INTRAMUSCULAR | Status: AC | PRN
  Administered 2021-01-30: 100 mL via INTRAVENOUS

## 2021-01-30 NOTE — Discharge Instructions (Signed)

## 2021-01-30 NOTE — ED Notes (Signed)
ED Provider at bedside. 

## 2021-07-27 ENCOUNTER — Other Ambulatory Visit: Payer: Self-pay | Admitting: General Surgery

## 2021-07-27 DIAGNOSIS — K828 Other specified diseases of gallbladder: Secondary | ICD-10-CM

## 2021-07-27 DIAGNOSIS — R1011 Right upper quadrant pain: Secondary | ICD-10-CM

## 2021-08-08 ENCOUNTER — Ambulatory Visit
Admission: RE | Admit: 2021-08-08 | Discharge: 2021-08-08 | Disposition: A | Discharge status: Home / Self Care | Payer: BC Managed Care – PPO | Source: Ambulatory Visit | Attending: General Surgery | Admitting: General Surgery

## 2021-08-08 ENCOUNTER — Other Ambulatory Visit: Payer: Self-pay

## 2021-08-08 DIAGNOSIS — R1011 Right upper quadrant pain: Secondary | ICD-10-CM | POA: Diagnosis present

## 2021-08-17 ENCOUNTER — Ambulatory Visit: Payer: BC Managed Care – PPO

## 2021-10-07 ENCOUNTER — Other Ambulatory Visit: Payer: Self-pay

## 2021-10-07 ENCOUNTER — Encounter: Payer: Self-pay | Admitting: Podiatry

## 2021-10-07 ENCOUNTER — Ambulatory Visit (INDEPENDENT_AMBULATORY_CARE_PROVIDER_SITE_OTHER): Payer: BC Managed Care – PPO | Admitting: Podiatry

## 2021-10-07 DIAGNOSIS — M722 Plantar fascial fibromatosis: Secondary | ICD-10-CM

## 2021-10-07 DIAGNOSIS — M7661 Achilles tendinitis, right leg: Secondary | ICD-10-CM | POA: Diagnosis not present

## 2021-10-07 NOTE — Progress Notes (Signed)
   Subjective: 70 y.o. female presenting today for follow-up evaluation of insertional Achilles tendinitis to the right lower extremity.  Patient states that is only minimally symptomatic.  Patient continues to walk 5 to 6 miles per day.  She is doing very well overall and presents for further treatment and evaluation   Past Medical History:  Diagnosis Date   Allergy    Diabetes mellitus without complication (HCC) 2009?   diet controlled   Hx of pancreatitis 2019   Hypertension      Objective: Physical Exam General: The patient is alert and oriented x3 in no acute distress.  Dermatology: Skin is warm, dry and supple bilateral lower extremities. Negative for open lesions or macerations bilateral.   Vascular: Dorsalis Pedis and Posterior Tibial pulses palpable bilateral.  Capillary fill time is immediate to all digits.  Neurological: Epicritic and protective threshold intact bilateral.   Musculoskeletal: Today it seems that the maximum point of tenderness is at the insertion of the Achilles tendon onto the posterior tubercle of the calcaneus.  Pain is very minimal to moderate all other joints range of motion within normal limits bilateral. Strength 5/5 in all groups bilateral.   Assessment: 1.  Insertional Achilles tendinitis right  Plan of Care:  1. Patient evaluated.  2.  Today we will forego any injections 3.  Recommend OTC NSAIDs as needed 5.  New OTC power step insoles provided for the patient.  Patient states that she got significant relief from the prior orthotic she was given 6. Instructed patient regarding therapies and modalities at home to alleviate symptoms.  Recommend daily stretching exercises 7.  Revitaderm Urea 40% lotion provided.  8. 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 No pain when she wrote the card *Going to [place].  Told  me about the [place] train from [place] to [place].  [employer].   Felecia Shelling, DPM [facility]  Dr. Felecia Shelling, DPM    [address] [place], [place] [address]                Office [phone]  Fax [phone]

## 2021-12-15 ENCOUNTER — Other Ambulatory Visit: Payer: Self-pay | Admitting: Otolaryngology

## 2021-12-15 DIAGNOSIS — H9312 Tinnitus, left ear: Secondary | ICD-10-CM

## 2022-09-20 ENCOUNTER — Encounter: Payer: Self-pay | Admitting: Internal Medicine

## 2022-09-20 ENCOUNTER — Ambulatory Visit (INDEPENDENT_AMBULATORY_CARE_PROVIDER_SITE_OTHER): Payer: BC Managed Care – PPO | Admitting: Internal Medicine

## 2022-09-20 VITALS — BP 116/70 | HR 62 | Ht 63.0 in | Wt 152.8 lb

## 2022-09-20 DIAGNOSIS — E785 Hyperlipidemia, unspecified: Secondary | ICD-10-CM | POA: Diagnosis not present

## 2022-09-20 DIAGNOSIS — E1165 Type 2 diabetes mellitus with hyperglycemia: Secondary | ICD-10-CM | POA: Diagnosis not present

## 2022-09-20 NOTE — Progress Notes (Signed)
Patient ID: Kim Lowery, female   DOB: [birth year], 71 y.o.   MRN: [id number]   HPI: Kim Lowery is a 71 y.o.-year-old female, initially referred by her gastroenterologist, Dr. Collene Mares, presenting for follow-up for DM2, dx ~2009, non-insulin-dependent, fairly well controlled, without long-term complications.  Last visit 2.5 years ago.  Interim history: No increased urination, blurry vision, nausea, chest pain. She gained weight since last visit, approximately 15 pounds.  She had higher blood sugars, in the 200s after meals and HbA1c was higher earlier this month.  Since then, she cut out starches.  Reviewed history: In the past, she participated in [employer]), she adopted a whole food, no oil, plant-based diet and also started exercise (walking 4 to 7 miles daily with her husband).  Her sugars improved significantly and she was able to reduce Metformin and even stop this completely afterwards.  Since her sugars improved so significantly, we were able to stop the CGM.  Reviewed HbA1c levels: 09/06/2022: HbA1c 7.6% 12/21/2021: HbA1c 7.3% Lab Results  Component Value Date   HGBA1C 5.9 (A) 03/25/2020   HGBA1C 5.9 (A) 09/23/2019   HGBA1C 6.7 (A) 10/11/2018  08/21/2019: HbA1c 6.8% 05/02/2019: HbA1c 7.4% 02/2019: HbA1c 7.4% 06/12/2018 : HbA1c 7.1% 12/21/2017: HbA1c 6.9% 06/08/2017: HbA1c 6.6%  She was previously on: - Metformin ER 1000 >> 2000 mg at bedtime >> 1000 mg with dinner >> stopped  She has a history of pancreatitis on 04/11/2018, possibly due to [employer]. She stopped Januvia at that time and also stopped Glucophage DAW 2000 mg daily and  Ezetimibe and Lovaza then.  We restarted these afterwards, but she stopped them recently.  She checks her blood sugars 0 to once a day: - am: 90's, 121 - 2h after b'fast: n/c - lunch: n/c - 2h after lunch: 90-120 - dinner: n/c - 2h after dinner: n/c - bedtime: 90s  Lowest sugar was 49 (Libre) >> 85, she has hypoglycemia  awareness in the 21s.  Highest sugar was 218 >> 164 >> 121.  -No CKD, last BUN/creatinine:  09/06/2022: Glucose 70 - 110 mg/dL 130 High    Sodium 136 - 145 mmol/L 141   Potassium 3.6 - 5.1 mmol/L 3.6   Chloride 97 - 109 mmol/L 103   Carbon Dioxide (CO2) 22.0 - 32.0 mmol/L 29.9   Urea Nitrogen (BUN) 7 - 25 mg/dL 13   Creatinine 0.6 - 1.1 mg/dL 0.8   Glomerular Filtration Rate (eGFR) >60 mL/min/1.73sq m 80   Calcium 8.7 - 10.3 mg/dL 9.4   AST  8 - 39 U/L 16   ALT  5 - 38 U/L 18   Alk Phos (alkaline Phosphatase) 34 - 104 U/L 61   Albumin 3.5 - 4.8 g/dL 4.2   Bilirubin, Total 0.3 - 1.2 mg/dL 0.5   Protein, Total 6.1 - 7.9 g/dL 6.6   A/G Ratio 1.0 - 5.0 gm/dL 1.8    Lab Results  Component Value Date   BUN 9 01/30/2021   BUN 12 01/27/2021   CREATININE 0.68 01/30/2021   CREATININE 0.75 01/27/2021  07/22/2019: Glucose 112, 7/0.9, GFR 76 05/02/2019: Glucose 135, 12/0.9, GFR 76 08/30/2018: 13/0.9, GFR 76, glucose 131 AST/ALT high, at 43/61 12/21/2017: 9/0.8, glucose 129, ALT slightly high, at 42 Previously on losartan 50, now off.  -+ HL; last set of lipids: Component 09/06/22 09/08/21  Cholesterol, Total 167 166  Triglyceride 122 151  HDL (High Density Lipoprotein) Cholesterol 54.4 47.4  LDL Calculated 88 88  VLDL Cholesterol 24 30  Cholesterol/HDL Ratio 3.1 3.5   11/24/2019: 173/139/34.4/93 07/22/2019: 117/94/36.4/37 05/02/2019: 168/116/33/95 06/07/2018: 179/210/46/91 12/15/2017: 139/120/44/71 No results found for: "CHOL", "HDL", "LDLCALC", "LDLDIRECT", "TRIG", "CHOLHDL"  Previously on Zetia and Lovaza, then on Livalo, now off.  - last eye exam was in 2022: No DR + cataracts [facility] -reviewed Dr. Wyvonne Lenz note)  - No numbness and tingling in her feet. Has a h/o plantar fasciitis.  On ASA 81.  Pt has FH of DM in mother, father, brother.  She was a Marine scientist practitioner-retired.  ROS: + see HPI  I reviewed pt's medications, allergies, PMH, social hx, family hx,  and changes were documented in the history of present illness. Otherwise, unchanged from my initial visit note.  Past Medical History:  Diagnosis Date   Allergy    Diabetes mellitus without complication ([place]) 6010?   diet controlled   Hx of pancreatitis 2019   Hypertension    Past Surgical History:  Procedure Laterality Date   ABDOMINAL HYSTERECTOMY  1996   Total   BREAST BIOPSY Left 1972, [place]   COLONOSCOPY  2018   HAND ARTHROPLASTY Bilateral 2014   Social History   Socioeconomic History   Marital status: Married    Spouse name: Not on file   Number of children: 4   Years of education: Not on file   Highest education level: Not on file  Occupational History   NP  Tobacco Use   Smoking status: Never Smoker   Smokeless tobacco: Never Used  Substance and Sexual Activity   Alcohol use: Yes    Comment: wine occasionally, 1 drink   Drug use: No   Current Outpatient Medications  Medication Sig Dispense Refill   aspirin EC 81 MG tablet Take 81 mg by mouth daily.     Cyanocobalamin (B-12 PO) Take by mouth.     estradiol (CLIMARA) 0.1 mg/24hr patch Place 1 patch onto the skin once a week.     lidocaine (XYLOCAINE) 2 % solution Use as directed 15 mLs in the mouth or throat every 4 (four) hours as needed for mouth pain. 200 mL 0   losartan-hydrochlorothiazide (HYZAAR) 50-12.5 MG tablet Hyzaar 50 mg-12.5 mg tablet     methylPREDNISolone (MEDROL DOSEPAK) 4 MG TBPK tablet 6 day dose pack - take as directed 21 tablet 0   ondansetron (ZOFRAN ODT) 4 MG disintegrating tablet Take 1 tablet (4 mg total) by mouth every 8 (eight) hours as needed for nausea or vomiting. 20 tablet 0   SINGULAIR 10 MG tablet Take 10 mg by mouth daily.     Vitamin D, Ergocalciferol, (DRISDOL) 50000 units CAPS capsule      No current facility-administered medications for this visit.    Allergies  Allergen Reactions   Avelox [Moxifloxacin Hcl In Nacl] Nausea And Vomiting   Other  Other (See Comments)    Pro Air Inhalers-produces Stridor  Floracarbons, hard to breathe  Pro Air Inhalers-produces Stridor Pro Air Inhalers-produces Stridor   Fluticasone Furoate-Vilanterol Palpitations   Macrobid [Nitrofurantoin Macrocrystal] Rash   Moxifloxacin Nausea And Vomiting   Pitavastatin Other (See Comments) and Palpitations    Bradycardia    Sulfa Antibiotics Rash   Family History  Problem Relation Age of Onset   Diabetes Mother    Diabetes Father    Gastric cancer Brother    PE: BP 116/70 (BP Location: Left Arm, Patient Position: Sitting, Cuff Size: Normal)   Pulse 62   Ht $R'5\' 3"'gq$  (1.6 m)  Wt 152 lb 12.8 oz (69.3 kg)   SpO2 98%   BMI 27.07 kg/m  Wt Readings from Last 3 Encounters:  09/20/22 152 lb 12.8 oz (69.3 kg)  01/27/21 137 lb (62.1 kg)  09/14/20 136 lb (61.7 kg)   Constitutional: normal weight, in NAD Eyes:  EOMI, no exophthalmos ENT: no neck masses, no cervical lymphadenopathy Cardiovascular: RRR, No MRG Respiratory: CTA B Musculoskeletal: no deformities Skin:no rashes Neurological: no tremor with outstretched hands. Diabetic Foot Exam - Simple   Simple Foot Form Diabetic Foot exam was performed with the following findings: Yes 09/20/2022  8:38 AM  Visual Inspection No deformities, no ulcerations, no other skin breakdown bilaterally: Yes Sensation Testing Intact to touch and monofilament testing bilaterally: Yes Pulse Check Posterior Tibialis and Dorsalis pulse intact bilaterally: Yes Comments    ASSESSMENT: 1. DM2, non-insulin-dependent, fairly well controlled, without long-term complications, but with hyperglycemia  2. HL  3.  H/o Overweight  PLAN:  1. Patient with longstanding, previously uncontrolled type 2 diabetes, however, off antidiabetic medications due to improved diet before our last visit, 2.5 years ago.  Before last visit, she previously, glipizide, Januvia, and metformin.  Of note, she has a history of pancreatitis in  03/2018, presumably from [place].  After switching to a whole food plant-based diet, HbA1c improved to 5.9% and sugars are all at goal without medications.  At last visit, we discussed about possibly continuing to see PCP and return to see me as needed. CGM interpretation: -At today's visit, we reviewed her CGM downloads: It appears that 98% of values are in target range (goal >70%), while 2% are higher than 180 (goal <25%), and 0% are lower than 70 (goal <4%).  The calculated average blood sugar is 117.  The projected HbA1c for the next 3 months (GMI) is 6.1%. -Reviewing the CGM trends, it appears that sugars are well controlled throughout the day, with only 1 instance in which her sugars dropped in the 40s overnight, most likely due to lying on her sensor.  Sugars are occasionally higher after breakfast, but the majority of the blood sugars after this meal and the rest of the meals are at goal. -At today's visit we discussed about possible factors that can influence blood sugars after meals including the type of breakfast, stress, dehydration, and also increased insulin resistance.  I suspect that her sugars increased due to relaxing her diet previously, gaining weight, and increasing her insulin resistance.  Discussed about ways to improve this, including limiting starches and concentrated sweets, increased exercise or diversifying exercise (she did add yoga to walking approximately 4 miles a day), and good hydration.  However, as of now, I would not recommend adding antidiabetic medication. - I suggested to:  Patient Instructions  Please continue off diabetes medicines.  Please return to see me in 1 year.  - advised to check sugars at different times of the day - 1x a day, rotating check times - advised for yearly eye exams >> she is UTD - return to clinic in 1 year, per her preference  2. HL -Reviewed the latest lipid panel from earlier this month, LDL 88, slightly higher than our goal of less  than 70, but the rest of the fractions were at goal -Previously on Zetia and Lovaza, then Livalo, then came off statins before last visit  3.  H/o Overweight -She lost a significant amount of weight before last visit due to consistent exercise and especially improving diet -She gained approximately 15 pounds  since last visit, but recently she started to cut down starches and increased exercise  Philemon Kingdom, MD PhD [facility] Endocrinology

## 2022-09-20 NOTE — Patient Instructions (Addendum)
Please continue off diabetes medicines.  Please return to see me in 1 year.

## 2022-10-10 IMAGING — CR DG CHEST 2V
1 series · 2 of 2 positions shown · non-contrast
Comparison: None.

CLINICAL DATA: Cough, shortness of breath, body aches.

EXAM:
CHEST - 2 VIEW

[Series 1: w chest pa · 0.14mm/px · 2 of 2 slices shown]
[im 1/2]
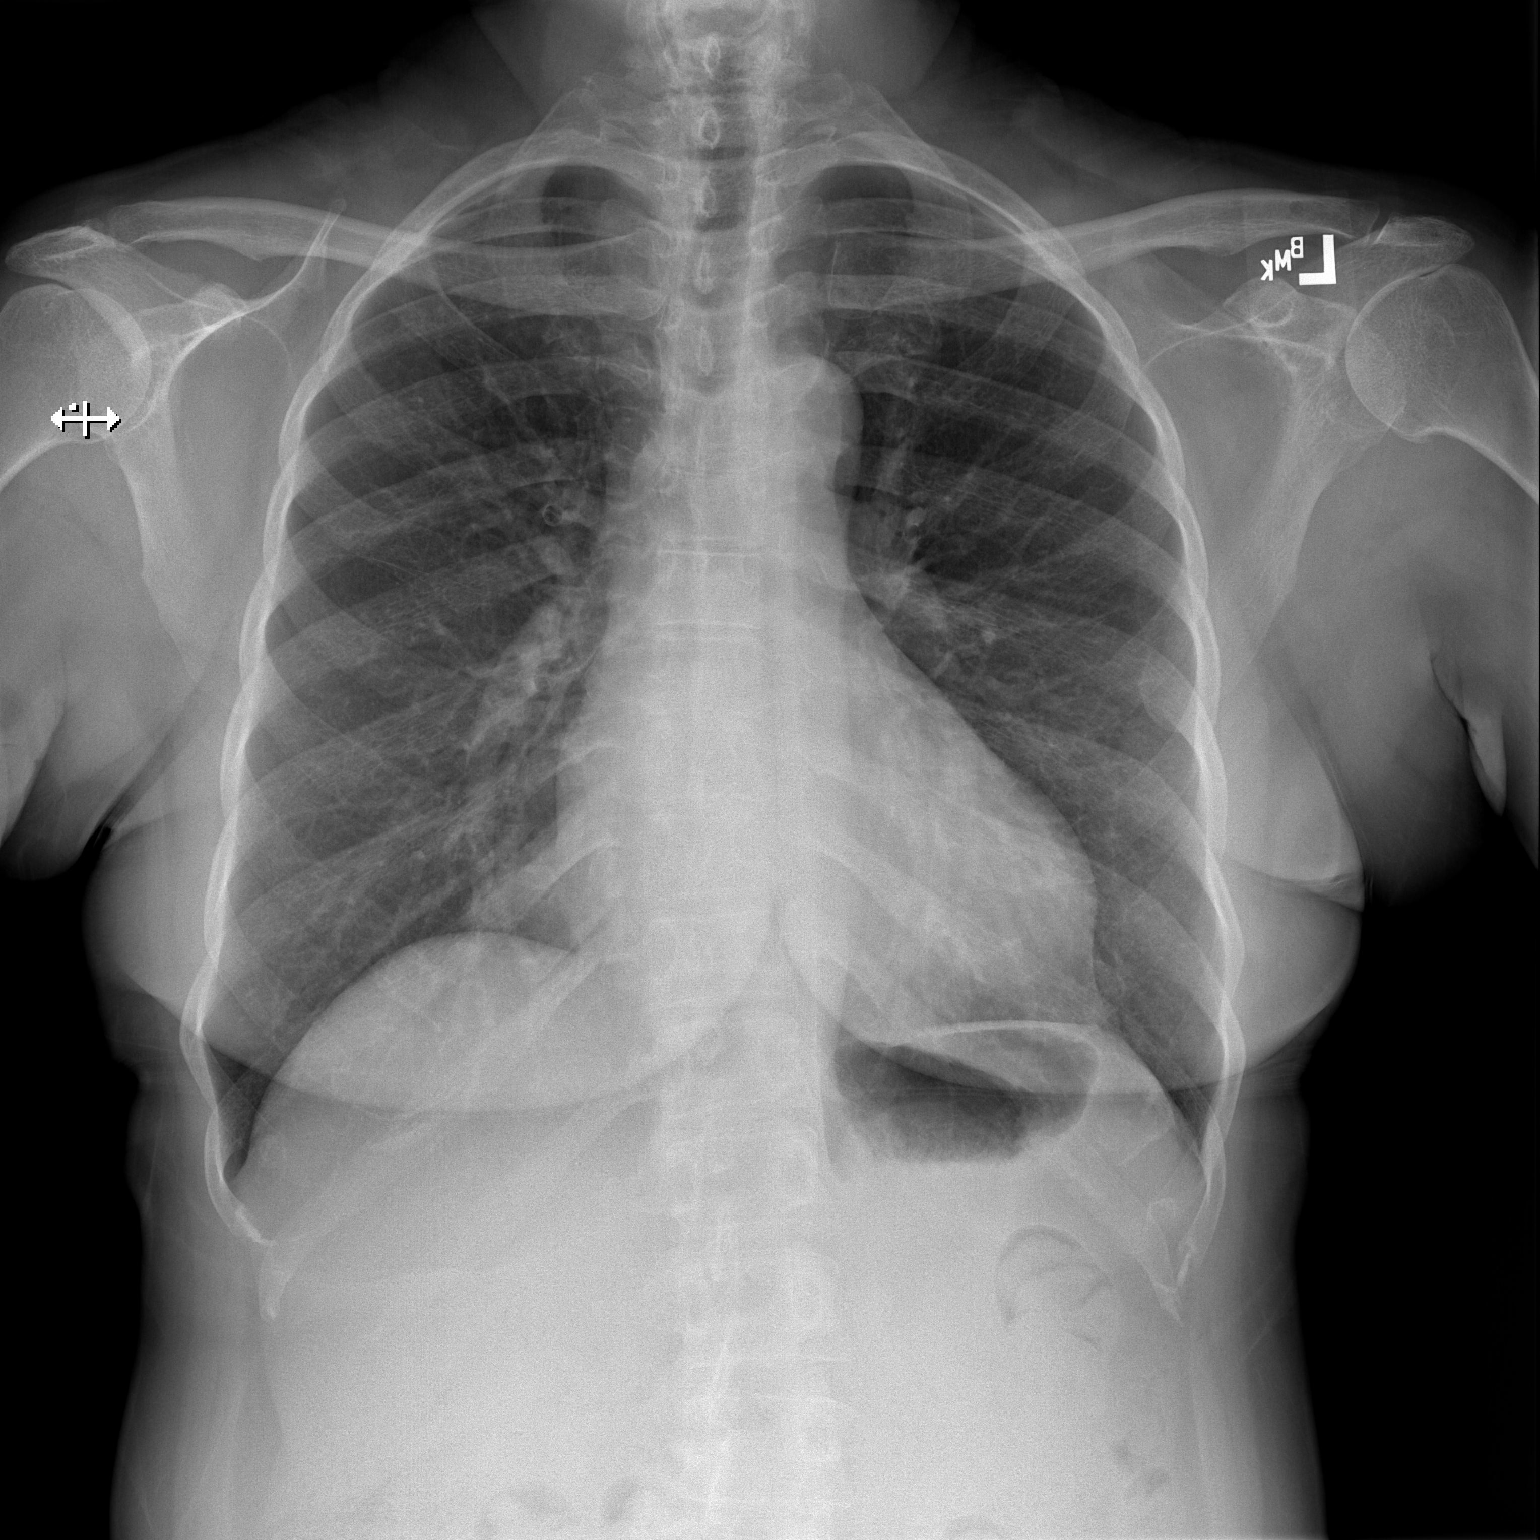
[im 2/2]
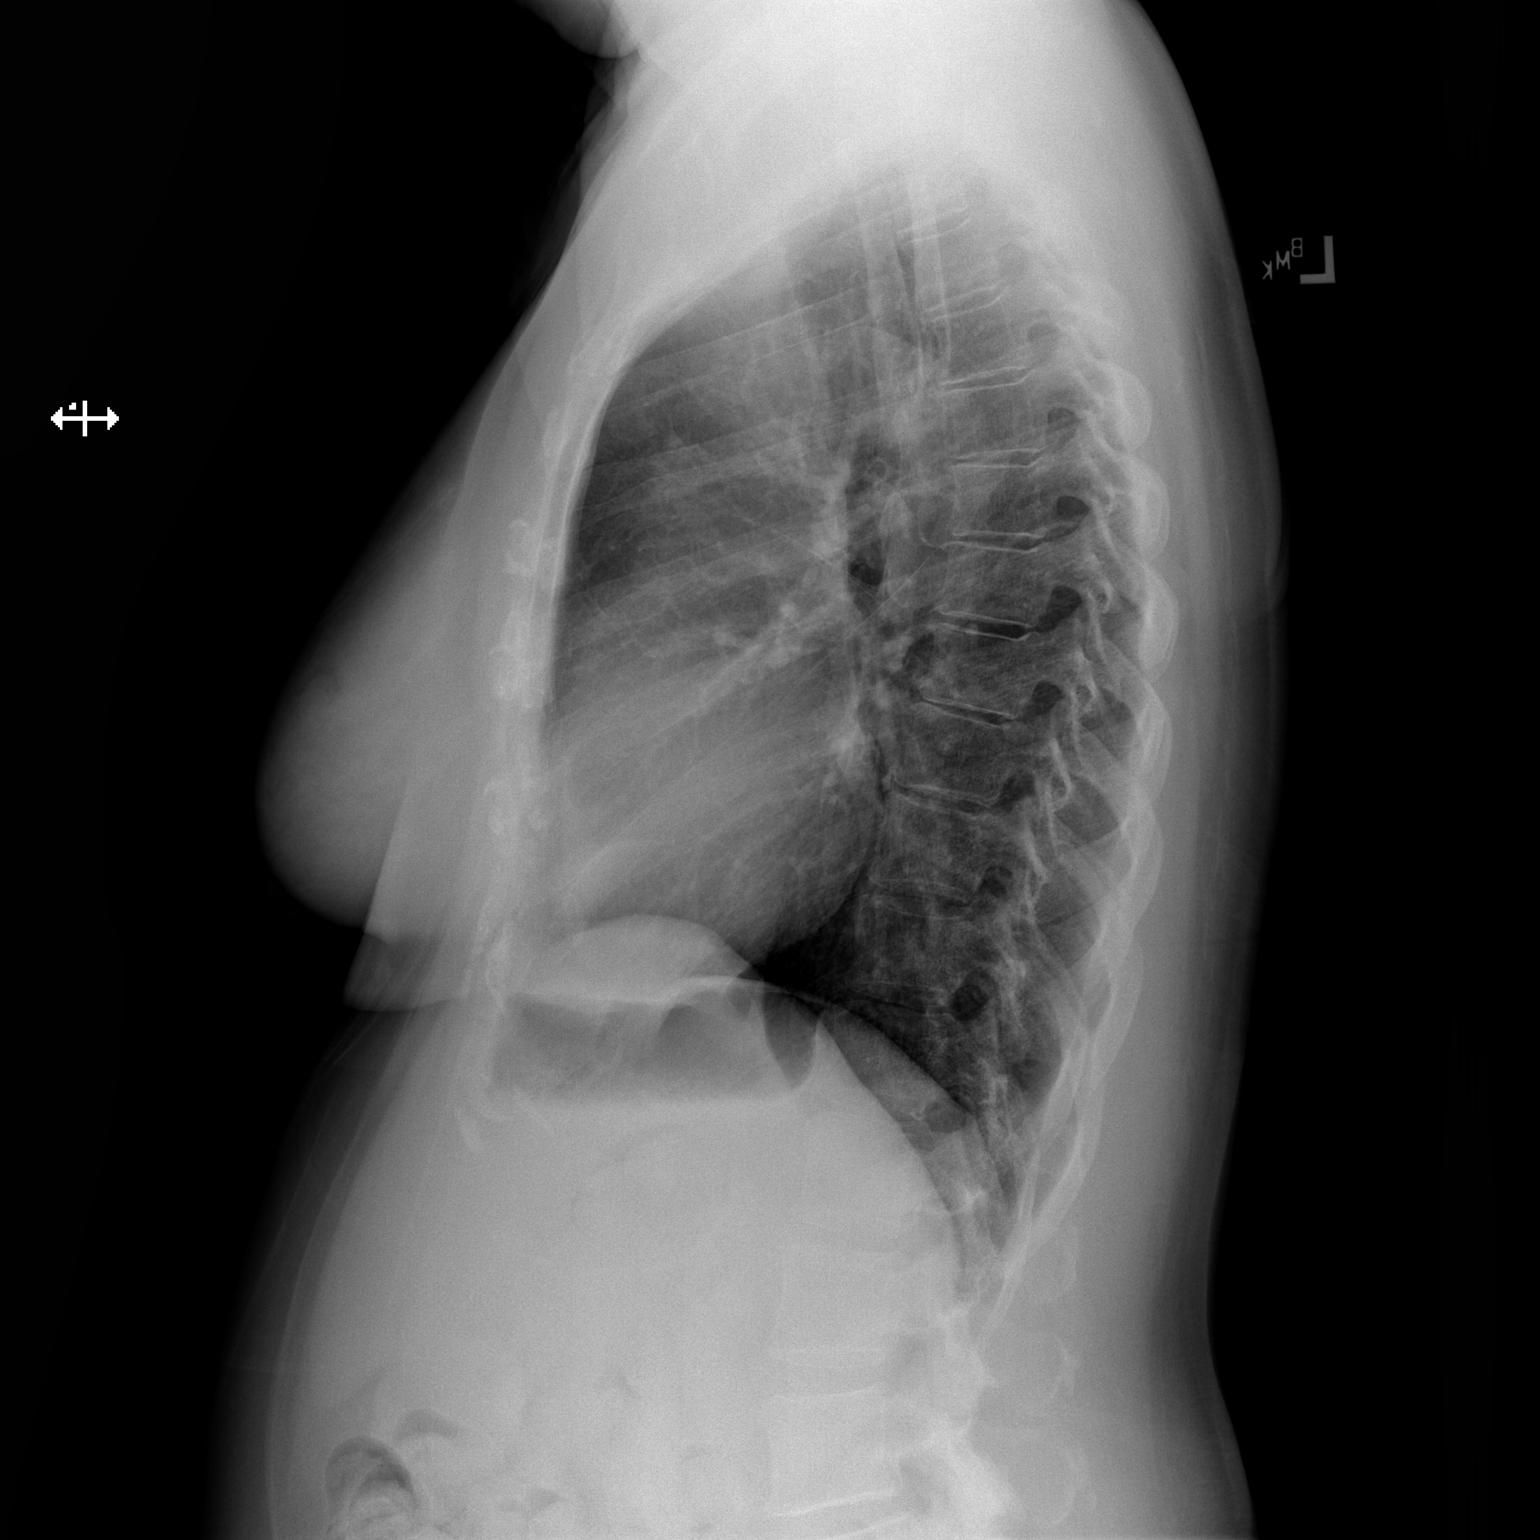

[2 of 2 positions shown; findings below may reference images not displayed]

FINDINGS: The heart size and mediastinal contours are within normal limits.
Both lungs are clear. The visualized skeletal structures are
unremarkable.
IMPRESSION: No active cardiopulmonary disease.

## 2023-04-17 ENCOUNTER — Ambulatory Visit
Admission: RE | Admit: 2023-04-17 | Discharge: 2023-04-17 | Disposition: A | Discharge status: Home / Self Care | Payer: BC Managed Care – PPO | Source: Ambulatory Visit | Attending: Physician Assistant | Admitting: Physician Assistant

## 2023-04-17 ENCOUNTER — Other Ambulatory Visit: Payer: Self-pay | Admitting: Physician Assistant

## 2023-04-17 DIAGNOSIS — R519 Headache, unspecified: Secondary | ICD-10-CM | POA: Insufficient documentation

## 2023-04-20 ENCOUNTER — Institutional Professional Consult (permissible substitution): Payer: BC Managed Care – PPO | Admitting: Plastic Surgery

## 2023-05-18 ENCOUNTER — Other Ambulatory Visit: Payer: Self-pay

## 2023-05-18 ENCOUNTER — Emergency Department: Payer: BC Managed Care – PPO

## 2023-05-18 ENCOUNTER — Emergency Department
Admission: EM | Admit: 2023-05-18 | Discharge: 2023-05-18 | Disposition: A | Discharge status: Home / Self Care | Payer: BC Managed Care – PPO | Attending: Emergency Medicine | Admitting: Emergency Medicine

## 2023-05-18 ENCOUNTER — Encounter: Payer: Self-pay | Admitting: Emergency Medicine

## 2023-05-18 DIAGNOSIS — K229 Disease of esophagus, unspecified: Secondary | ICD-10-CM

## 2023-05-18 DIAGNOSIS — W448XXA Other foreign body entering into or through a natural orifice, initial encounter: Secondary | ICD-10-CM | POA: Insufficient documentation

## 2023-05-18 DIAGNOSIS — E119 Type 2 diabetes mellitus without complications: Secondary | ICD-10-CM | POA: Diagnosis not present

## 2023-05-18 DIAGNOSIS — K2289 Other specified disease of esophagus: Secondary | ICD-10-CM | POA: Insufficient documentation

## 2023-05-18 DIAGNOSIS — T18128A Food in esophagus causing other injury, initial encounter: Secondary | ICD-10-CM | POA: Diagnosis present

## 2023-05-18 NOTE — ED Triage Notes (Signed)
Presents with possible f/b in throat   feels like she is not able to swollow

## 2023-05-18 NOTE — ED Notes (Signed)
Pt. To ED for vegetable caught in throat.

## 2023-05-18 NOTE — ED Triage Notes (Signed)
Pt was eating vegetables this morning and now feels like it is lodged in her throat.

## 2023-05-18 NOTE — ED Notes (Signed)
ATtempted big gulp of soda without success. Pt was able to keep it down.

## 2023-05-18 NOTE — ED Notes (Signed)
Pt. To RN desk, states obstruction has cleared, and she would like to go home [place], NP notified. Pt. To be discharged.

## 2023-05-18 NOTE — ED Provider Notes (Signed)
   [facility] Provider Note    Event Date/Time   First MD Initiated Contact with Patient 05/18/23 0800     (approximate)   History   Foreign Body   HPI  Kim Lowery is a 72 y.o. female  with history of DM II, biliary dyskinesia and as listed in EMR presents to the emergency department for treatment and evaluation of food bolus. While eating breakfast this morning, she swallowed something and doesn't feel like it went down. This has never happened to her before. She denies difficulty breathing. She is tolerating secretions and speech is clear.      Physical Exam   Triage Vital Signs: ED Triage Vitals  Enc Vitals Group     BP 05/18/23 0750 (!) 163/80     Pulse Rate 05/18/23 0750 (!) 58     Resp 05/18/23 0750 18     Temp 05/18/23 0750 98.2 F (36.8 C)     Temp Source 05/18/23 0750 Oral     SpO2 05/18/23 0750 100 %     Weight 05/18/23 0749 152 lb 1.9 oz (69 kg)     Height 05/18/23 0749 5\' 3"  (1.6 m)     Head Circumference --      Peak Flow --      Pain Score 05/18/23 0751 0     Pain Loc --      Pain Edu? --      Excl. in GC? --     Most recent vital signs: Vitals:   05/18/23 0750  BP: (!) 163/80  Pulse: (!) 58  Resp: 18  Temp: 98.2 F (36.8 C)  SpO2: 100%    General: Awake, no distress.  CV:  Good peripheral perfusion.  Resp:  Normal effort.  Abd:  No distention.  Other:     ED Results / Procedures / Treatments   Labs (all labs ordered are listed, but only abnormal results are displayed) Labs Reviewed  CBC WITH DIFFERENTIAL/PLATELET  COMPREHENSIVE METABOLIC PANEL     EKG  Not indicated.   RADIOLOGY  Image and radiology report reviewed and interpreted by me. Radiology report consistent with the same.  Patient declined  PROCEDURES:  Critical Care performed: No  Procedures   MEDICATIONS ORDERED IN ED:  Medications - No data to display   IMPRESSION / MDM / ASSESSMENT AND PLAN / ED COURSE   I have reviewed  the triage note.  Differential diagnosis includes, but is not limited to, food bolus, esophageal stricture, globus hystericus  Patient's presentation is most consistent with acute illness / injury with system symptoms.  72 year old female presenting to the emergency department due to food bolus.  Nursing staff had given her warm cola and she now no longer feels that there is food stuck in her throat.  She requested discharge with no further evaluation or treatment.      FINAL CLINICAL IMPRESSION(S) / ED DIAGNOSES   Final diagnoses:  Esophageal abnormality     Rx / DC Orders   ED Discharge Orders     None        Note:  This document was prepared using Dragon voice recognition software and may include unintentional dictation errors.   Chinita Pester, FNP 05/18/23 1154    Merwyn Katos, MD 05/18/23 [phone]

## 2023-08-01 ENCOUNTER — Encounter: Payer: Self-pay | Admitting: Internal Medicine

## 2023-08-01 ENCOUNTER — Ambulatory Visit (INDEPENDENT_AMBULATORY_CARE_PROVIDER_SITE_OTHER): Payer: BC Managed Care – PPO | Admitting: Internal Medicine

## 2023-08-01 ENCOUNTER — Other Ambulatory Visit: Payer: Self-pay | Admitting: Internal Medicine

## 2023-08-01 VITALS — BP 122/70 | HR 57 | Ht 63.0 in | Wt 144.8 lb

## 2023-08-01 DIAGNOSIS — Z794 Long term (current) use of insulin: Secondary | ICD-10-CM

## 2023-08-01 DIAGNOSIS — E1165 Type 2 diabetes mellitus with hyperglycemia: Secondary | ICD-10-CM

## 2023-08-01 DIAGNOSIS — E785 Hyperlipidemia, unspecified: Secondary | ICD-10-CM

## 2023-08-01 DIAGNOSIS — E119 Type 2 diabetes mellitus without complications: Secondary | ICD-10-CM

## 2023-08-01 LAB — GLUCOSE, POCT (MANUAL RESULT ENTRY): POC Glucose: 279 mg/dL — AB (ref 70–99)

## 2023-08-01 LAB — POCT GLYCOSYLATED HEMOGLOBIN (HGB A1C): Hemoglobin A1C: 8.1 % — AB (ref 4.0–5.6)

## 2023-08-01 MED ORDER — FREESTYLE LIBRE 2 SENSOR MISC: 3 refills | Status: DC

## 2023-08-01 MED ORDER — FREESTYLE LIBRE 3 SENSOR MISC: 1.0000 | 3 refills | Status: DC

## 2023-08-01 MED ORDER — TRESIBA FLEXTOUCH 100 UNIT/ML ~~LOC~~ SOPN: 12.0000 [IU] | PEN_INJECTOR | Freq: Every day | SUBCUTANEOUS | 1 refills | Status: DC

## 2023-08-01 MED ORDER — INSULIN PEN NEEDLE 32G X 4 MM MISC
3 refills | Status: DC
Start: 2023-08-01 — End: 2023-09-26

## 2023-08-01 MED ORDER — TRESIBA FLEXTOUCH 100 UNIT/ML ~~LOC~~ SOPN: 12.0000 [IU] | PEN_INJECTOR | Freq: Every day | SUBCUTANEOUS | 0 refills | Status: DC

## 2023-08-01 NOTE — Patient Instructions (Addendum)
Please start: - Tresiba 12 units daily - may need to increase by 2 units every 3 days until sugars in am are <130  Please return to see me on 09/26/2023.

## 2023-08-01 NOTE — Addendum Note (Signed)
Addended by: Pollie Meyer on: 08/01/2023 11:57 AM   Modules accepted: Orders

## 2023-08-01 NOTE — Progress Notes (Addendum)
Patient ID: Kim Lowery, female   DOB: Dec 24, 1950, 72 y.o.   MRN: [id number]   HPI: Kim Lowery is a 72 y.o.-year-old female, initially referred by her gastroenterologist, Dr. Loreta Ave, presenting for follow-up for DM2, dx ~2009, now insulin-dependent, fairly well controlled, without long-term complications.  Last visit 11 months ago.  Interim history: No increased urination, blurry vision, nausea, chest pain. She had a steroid trigger point inj. Also, she had surgery (labioplasty), then went in vacation recently and developed Covid19 infection afterwards - 1 mo ago. However, she did not see high blood sugars after any of these events, blood sugar checked yesterday were in the 200s consistently.  In the office, today, blood sugar was 279. She continues on a plant-based diet.  Reviewed history: In the past, she participated in [employer]), she adopted a whole food, no oil, plant-based diet and also started exercise (walking 4 to 7 miles daily with her husband).  Her sugars improved significantly and she was able to reduce Metformin and even stop this completely afterwards.  Since her sugars improved so significantly, we were able to stop the CGM.  Reviewed HbA1c levels: 09/06/2022: HbA1c 7.6% 12/21/2021: HbA1c 7.3% Lab Results  Component Value Date   HGBA1C 5.9 (A) 03/25/2020   HGBA1C 5.9 (A) 09/23/2019   HGBA1C 6.7 (A) 10/11/2018  08/21/2019: HbA1c 6.8% 05/02/2019: HbA1c 7.4% 02/2019: HbA1c 7.4% 06/12/2018 : HbA1c 7.1% 12/21/2017: HbA1c 6.9% 06/08/2017: HbA1c 6.6%  She was previously on: - Metformin ER 1000 >> 2000 mg at bedtime >> 1000 mg with dinner >> stopped  She has a history of pancreatitis on 04/11/2018, possibly due to Januvia. She stopped Januvia at that time and also stopped Glucophage DAW 2000 mg daily and  Ezetimibe and Lovaza then.  We restarted these afterwards, but she stopped them recently.  She checks her blood sugars 0 to once a day: - am: 90's,  121 >> 116-134 with all test strips, 217/256 with new test tubes - 2h after b'fast: n/c >> 209 with new test strips - lunch: n/c >> 225 - 2h after lunch: 90-120 >> 183 - dinner: n/c >> 215, 223 - 2h after dinner: n/c >>  221 - bedtime: 90s >> see above  Lowest sugar was 49 (Libre) >> 85 >> 91, she has hypoglycemia awareness in the 70s.  Highest sugar was 218 >> 164 >> 121 >> 256.  -No CKD, last BUN/creatinine:   Lab Results  Component Value Date   BUN 9 01/30/2021   BUN 12 01/27/2021   CREATININE 0.68 01/30/2021   CREATININE 0.75 01/27/2021  07/22/2019: Glucose 112, 7/0.9, GFR 76 05/02/2019: Glucose 135, 12/0.9, GFR 76 08/30/2018: 13/0.9, GFR 76, glucose 131 AST/ALT high, at 43/61 12/21/2017: 9/0.8, glucose 129, ALT slightly high, at 42 No results found for: "MICRALBCREAT" Previously on losartan 50, now off.  -+ HL; last set of lipids: Component 09/06/22 09/08/21  Cholesterol, Total 167 166  Triglyceride 122 151  HDL (High Density Lipoprotein) Cholesterol 54.4 47.4  LDL Calculated 88 88  VLDL Cholesterol 24 30  Cholesterol/HDL Ratio 3.1 3.5   11/24/2019: 173/139/34.4/93 07/22/2019: 117/94/36.4/37 05/02/2019: 168/116/33/95 06/07/2018: 179/210/46/91 12/15/2017: 139/120/44/71 No results found for: "CHOL", "HDL", "LDLCALC", "LDLDIRECT", "TRIG", "CHOLHDL"  Previously on Zetia and Lovaza, then on Livalo, now off.  - last eye exam was in 09/2022: No DR reportedly; + cataracts [facility] -reviewed Dr. Rexford Maus note)  - No numbness and tingling in her feet. Has a h/o plantar fasciitis.  Last foot exam 08/2022.  On ASA 81.  Pt has FH of DM in mother, father, brother.  She was a Engineer, civil (consulting) practitioner-retired.  ROS: + see HPI  I reviewed pt's medications, allergies, PMH, social hx, family hx, and changes were documented in the history of present illness. Otherwise, unchanged from my initial visit note.  Past Medical History:  Diagnosis Date   Allergy    Diabetes mellitus  without complication (HCC) 2009?   diet controlled   Hx of pancreatitis 2019   Hypertension    Past Surgical History:  Procedure Laterality Date   ABDOMINAL HYSTERECTOMY  1996   Total   BREAST BIOPSY Left 1972, 1999   CESAREAN SECTION  1988   COLONOSCOPY  2018   HAND ARTHROPLASTY Bilateral 2014   Social History   Socioeconomic History   Marital status: Married    Spouse name: Not on file   Number of children: 4   Years of education: Not on file   Highest education level: Not on file  Occupational History   NP  Tobacco Use   Smoking status: Never Smoker   Smokeless tobacco: Never Used  Substance and Sexual Activity   Alcohol use: Yes    Comment: wine occasionally, 1 drink   Drug use: No   Current Outpatient Medications  Medication Sig Dispense Refill   aspirin EC 81 MG tablet Take 81 mg by mouth daily.     Cyanocobalamin (B-12 PO) Take by mouth.     estradiol (CLIMARA) 0.1 mg/24hr patch Place 1 patch onto the skin once a week.     losartan-hydrochlorothiazide (HYZAAR) 50-12.5 MG tablet Hyzaar 50 mg-12.5 mg tablet     ondansetron (ZOFRAN ODT) 4 MG disintegrating tablet Take 1 tablet (4 mg total) by mouth every 8 (eight) hours as needed for nausea or vomiting. 20 tablet 0   Vitamin D, Ergocalciferol, (DRISDOL) 50000 units CAPS capsule      No current facility-administered medications for this visit.    Allergies  Allergen Reactions   Avelox [Moxifloxacin Hcl In Nacl] Nausea And Vomiting   Other Other (See Comments)    Pro Air Inhalers-produces Stridor  Floracarbons, hard to breathe  Pro Air Inhalers-produces Stridor Pro Air Inhalers-produces Stridor   Fluticasone Furoate-Vilanterol Palpitations   Macrobid [Nitrofurantoin Macrocrystal] Rash   Moxifloxacin Nausea And Vomiting   Pitavastatin Other (See Comments) and Palpitations    Bradycardia    Sulfa Antibiotics Rash   Family History  Problem Relation Age of Onset   Diabetes Mother    Diabetes Father     Gastric cancer Brother    PE: BP 122/70   Pulse (!) 57   Ht 5\' 3"  (1.6 m)   Wt 144 lb 12.8 oz (65.7 kg)   SpO2 96%   BMI 25.65 kg/m  Wt Readings from Last 3 Encounters:  08/01/23 144 lb 12.8 oz (65.7 kg)  05/18/23 152 lb 1.9 oz (69 kg)  09/20/22 152 lb 12.8 oz (69.3 kg)   Constitutional: normal weight, in NAD Eyes:  EOMI, no exophthalmos ENT: no neck masses, no cervical lymphadenopathy Cardiovascular: RRR, No MRG Respiratory: CTA B Musculoskeletal: no deformities Skin:no rashes Neurological: no tremor with outstretched hands. Diabetic Foot Exam - Simple   Simple Foot Form Diabetic Foot exam was performed with the following findings: Yes 08/01/2023  9:15 AM  Visual Inspection No deformities, no ulcerations, no other skin breakdown bilaterally: Yes Sensation Testing Intact to touch and monofilament testing bilaterally: Yes Pulse Check Posterior Tibialis and Dorsalis pulse intact bilaterally: Yes  Comments    ASSESSMENT: 1. DM2, now insulin-dependent, without long-term complications, but with hyperglycemia  2. HL  3.  H/o Overweight  PLAN:  1. Patient with longstanding, previously uncontrolled, type 2 diabetes, but off antidiabetic medications due to improved diet before our visit in 2021.  She was previously on glipizide, metformin, Januvia.  After switching to whole food plant-based diet, HbA1c improved to 5.9% and sugars were at goal without medications.  We did discuss about possibly continuing to see PCP and return to see me as needed but she would prefer to be followed here on a yearly basis. -At last visit, reviewing CGM trends, sugars were well-controlled throughout the day but occasionally higher after breakfast.  We discussed about possible factors that could influence blood sugars after meals including the type of breakfast, stress, dehydration, and also increased insulin resistance.  She did relax the diet before last visit and gaining weight.  We discussed about  improving this, including limiting starches and concentrated sweets, increased exercise and diet fortifying exercise and good hydration.  I did not recommend to add diabetic medication at that time.  HbA1c was 7.6%. - at today's visit, CBG 279 and an HbA1c is much higher.  This is not necessarily reflected in the sugars at home and up to yesterday.  There is a significant increase in blood sugars starting yesterday and she does not remember any changes in her medications or health status before this.  However, upon returning, she changed her test strips right before starting to see these high numbers.  I suspect that in the setting of an increasing HbA1c, her test strips were not accurate before and these were giving her falsely reassuring values.  I strongly advised her to reattach the CGM and sent a prescription for the freestyle libre CGM to her pharmacy.  I also advised her to throw away the old test strips. 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  Discussed about titrating the dose up if needed.  We cannot use a GLP-1 receptor agonist due to previous history of pancreatitis while on Januvia. - I suggested to:  Patient Instructions  Please start: - Tresiba 12 units daily - may need to increase by 2 units every 3 days until sugars in am are <130  Please return to see me on 09/26/2023.  - we checked her HbA1c: 8.1% (higher) - advised to check sugars at different times of the day -  1x a day, rotating check times - advised for yearly eye exams >> she is UTD - return to clinic in 1.5 months  2. HL -- Reviewed latest lipid panel from 08/2022: LDL 88, slightly higher than goal, but the rest the fractions at goal No results found for: "CHOL", "HDL", "LDLCALC", "LDLDIRECT", "TRIG", "CHOLHDL" -Previously on Zetia and Lovaza, the Livalo, then came off statins before our visit in 2021  3.  H/o Overweight -She previously lost a significant amount  of weight before visit in 2021 due to consistent exercise and especially improving diet -At last visit, however, she had gained 15 pounds but she started to cut down starches and increase exercise -At today's visit, she lost 8 lbs since last OV, possibly in the setting of glucotoxicity  Carlus Pavlov, MD PhD [facility] Endocrinology

## 2023-08-02 MED ORDER — FREESTYLE LIBRE 2 READER DEVI
0 refills | Status: DC
Start: 2023-08-02 — End: 2023-09-24

## 2023-08-02 NOTE — Telephone Encounter (Signed)
Reader has been sent, please advise on other part of message.

## 2023-08-05 ENCOUNTER — Encounter: Payer: Self-pay | Admitting: Internal Medicine

## 2023-08-06 ENCOUNTER — Ambulatory Visit
Admission: RE | Admit: 2023-08-06 | Discharge: 2023-08-06 | Disposition: A | Discharge status: Home / Self Care | Payer: BC Managed Care – PPO | Source: Ambulatory Visit | Attending: Physician Assistant | Admitting: Physician Assistant

## 2023-08-06 ENCOUNTER — Other Ambulatory Visit: Payer: Self-pay | Admitting: Physician Assistant

## 2023-08-06 DIAGNOSIS — R1084 Generalized abdominal pain: Secondary | ICD-10-CM

## 2023-08-06 DIAGNOSIS — R634 Abnormal weight loss: Secondary | ICD-10-CM

## 2023-08-06 LAB — POCT I-STAT CREATININE: Creatinine, Ser: 0.9 mg/dL (ref 0.44–1.00)

## 2023-08-06 MED ORDER — IOHEXOL 300 MG/ML  SOLN
80.0000 mL | Freq: Once | INTRAMUSCULAR | Status: AC | PRN
  Administered 2023-08-06: 80 mL via INTRAVENOUS

## 2023-08-08 ENCOUNTER — Other Ambulatory Visit: Payer: BC Managed Care – PPO

## 2023-09-03 IMAGING — US US ABDOMEN LIMITED
1 series · 14 of 25 positions shown · non-contrast
Comparison: CT 01/30/2021.

CLINICAL DATA: Right upper quadrant pain.

EXAM:
ULTRASOUND ABDOMEN LIMITED RIGHT UPPER QUADRANT

[Series 1: us abdomen limited ruq (liver/gb) · 14 of 49 slices shown]
[im 1/49]
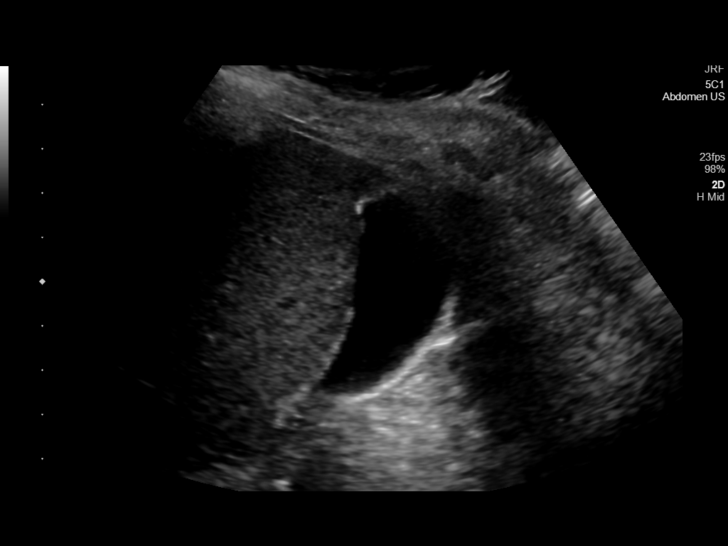
[im 5/49]
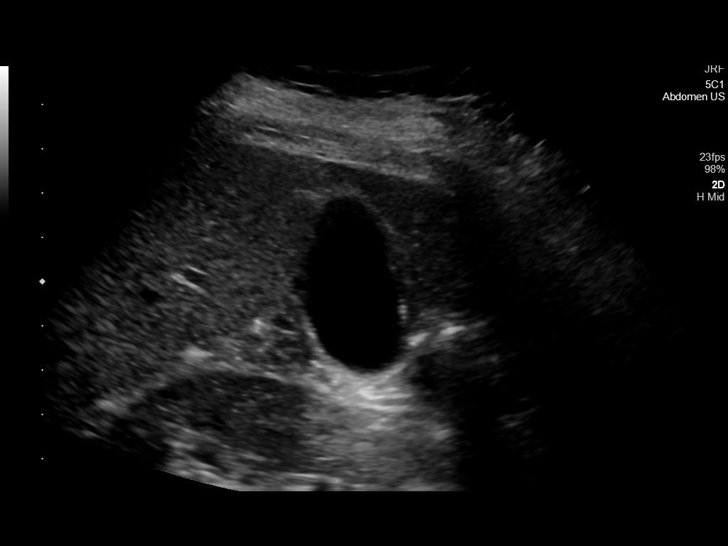
[im 9/49]
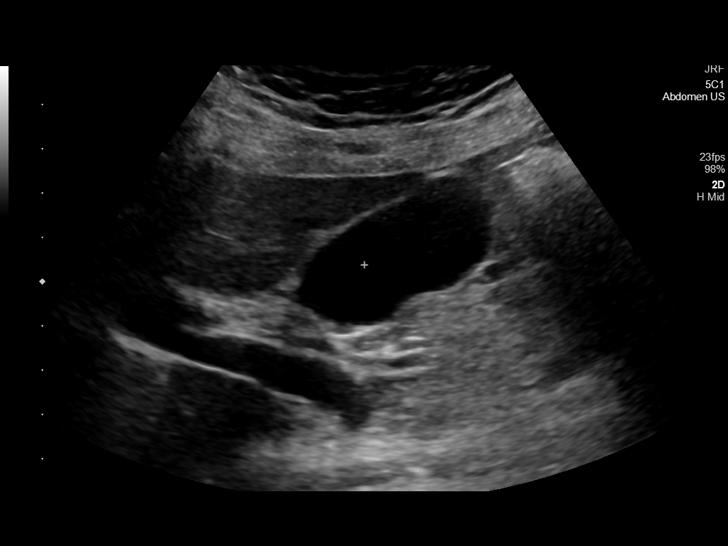
[im 13/49]
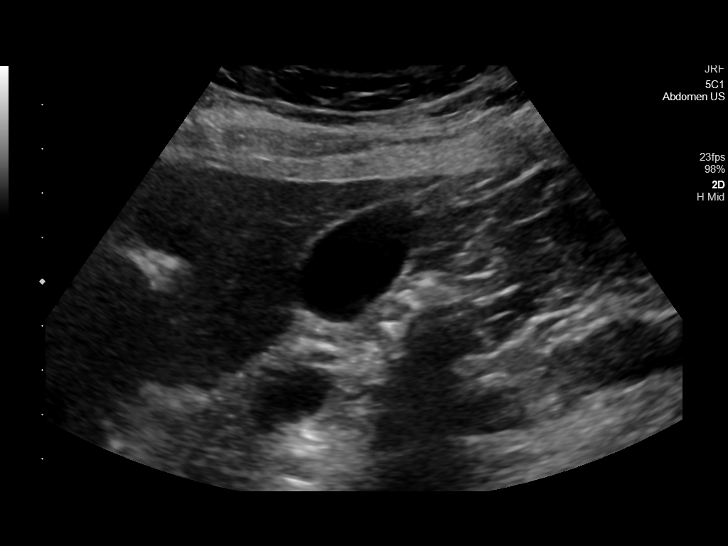
[im 17/49]
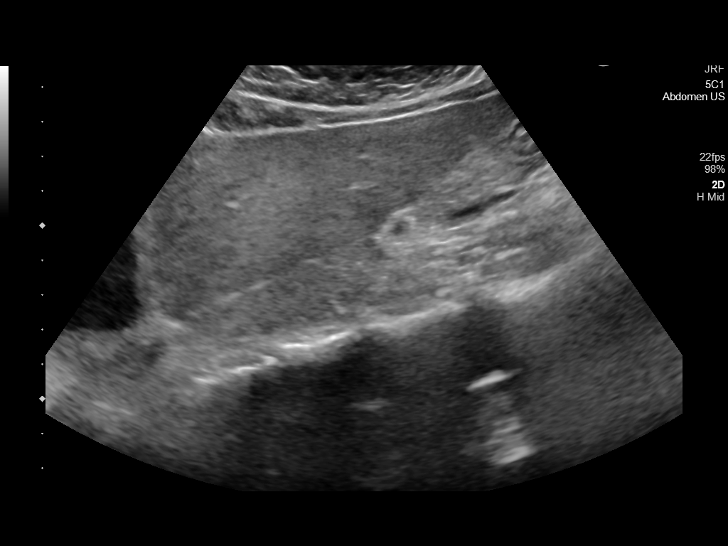
[im 19/49]
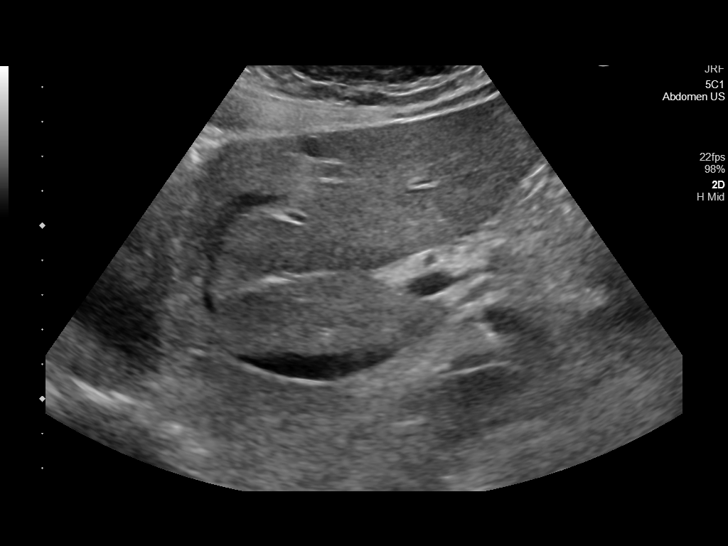
[im 23/49]
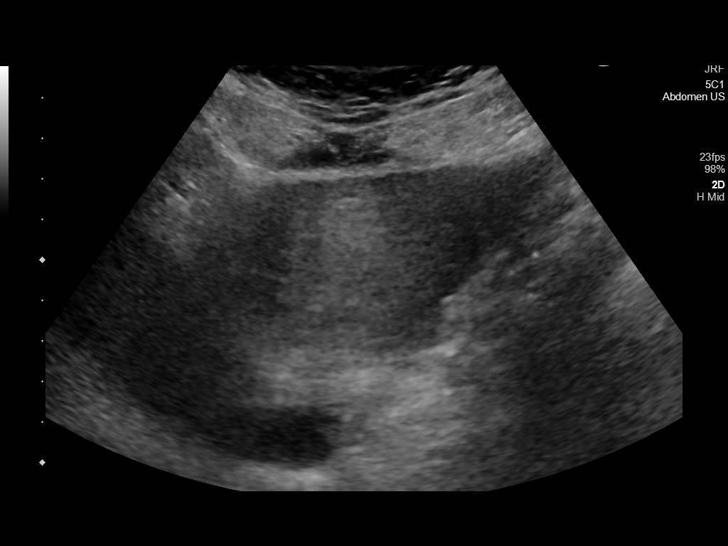
[im 27/49]
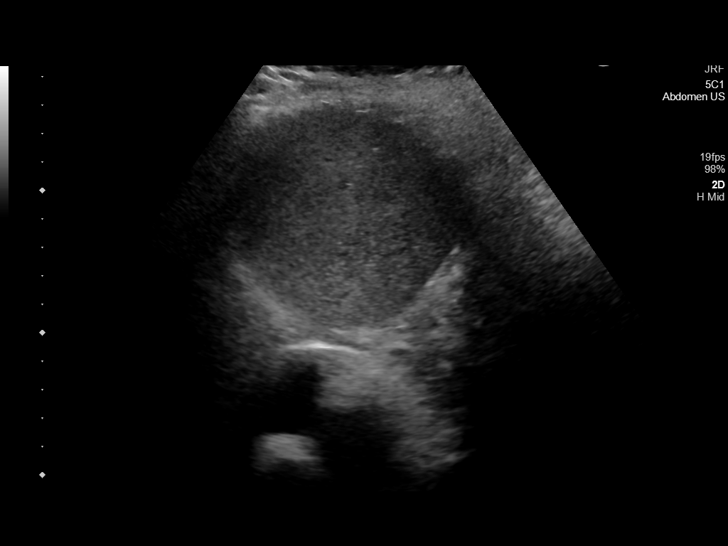
[im 31/49]
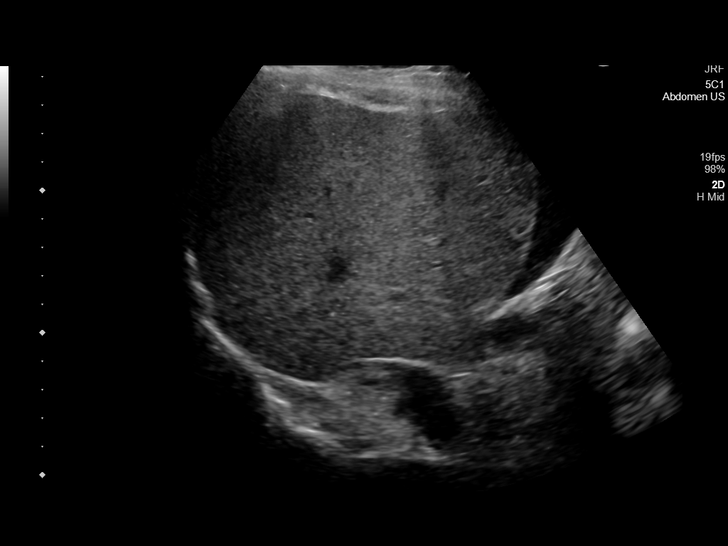
[im 33/49]
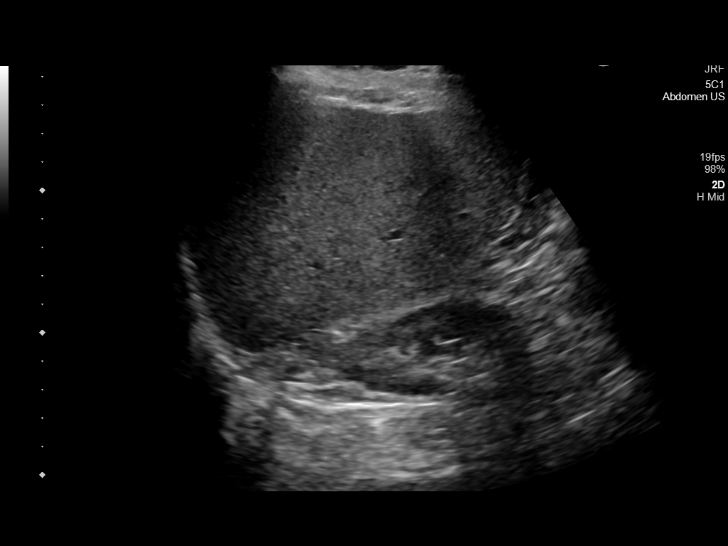
[im 37/49]
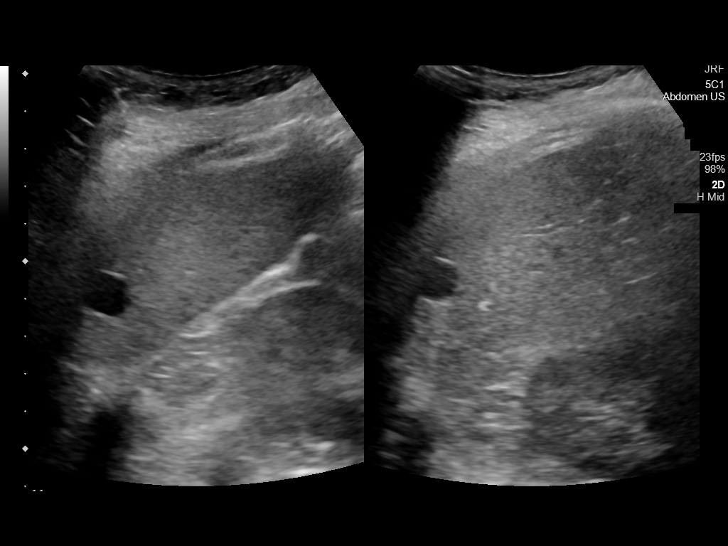
[im 41/49]
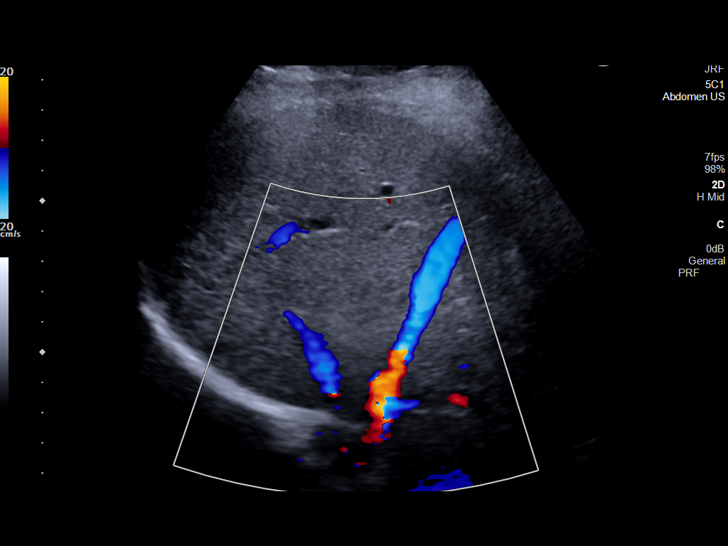
[im 45/49]
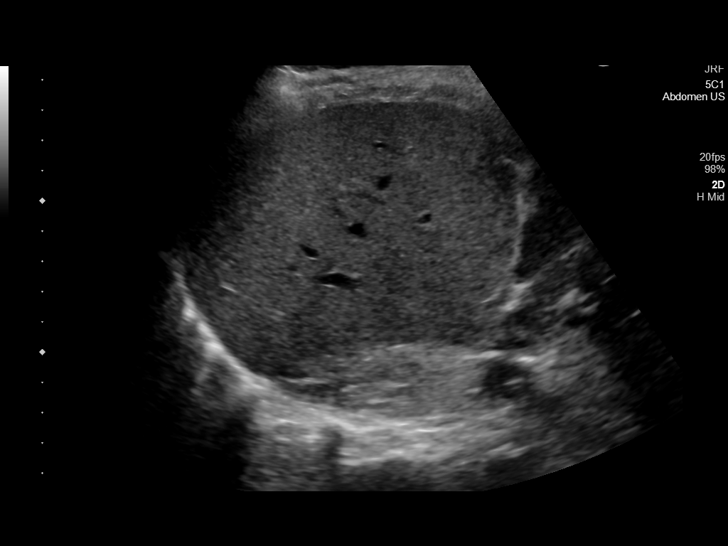
[im 49/49]
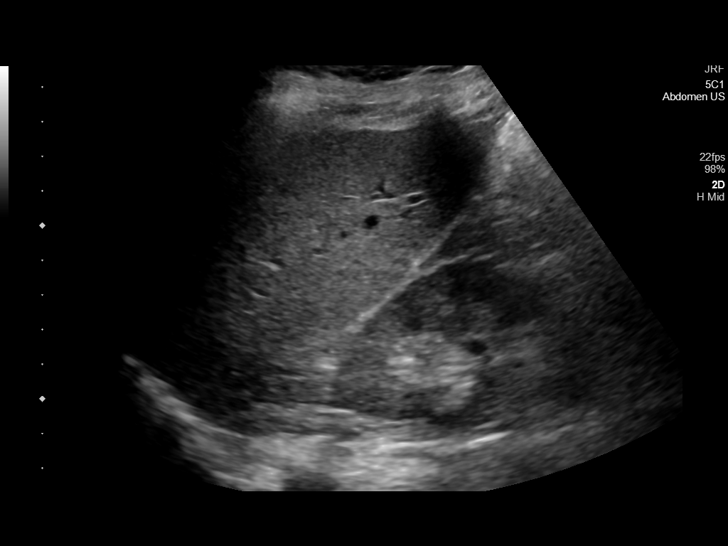

[14 of 25 positions shown; findings below may reference images not displayed]

FINDINGS: Gallbladder:

No gallstones or wall thickening visualized. No sonographic Murphy
sign noted by sonographer.

Common bile duct:

Diameter: 2.0 mm

Liver:

A 1.4 cm and 0.7 cm simple cyst noted. Within normal limits in
parenchymal echogenicity. Portal vein is patent on color Doppler
imaging with normal direction of blood flow towards the liver.

Other: None.
IMPRESSION: No gallstones or biliary distention. 2 small simple cysts are noted
in the liver. No acute abnormality identified.

## 2023-09-17 ENCOUNTER — Encounter: Payer: Self-pay | Admitting: Internal Medicine

## 2023-09-18 MED ORDER — DEXCOM G7 SENSOR MISC: 3 refills | Status: DC

## 2023-09-24 MED ORDER — DEXCOM G7 RECEIVER DEVI: 1.0000 | 0 refills | Status: DC

## 2023-09-24 NOTE — Addendum Note (Signed)
Addended by: Pollie Meyer on: 09/24/2023 08:08 AM   Modules accepted: Orders

## 2023-09-26 ENCOUNTER — Ambulatory Visit: Payer: BC Managed Care – PPO | Admitting: Internal Medicine

## 2023-09-26 ENCOUNTER — Encounter: Payer: Self-pay | Admitting: Internal Medicine

## 2023-09-26 ENCOUNTER — Ambulatory Visit (INDEPENDENT_AMBULATORY_CARE_PROVIDER_SITE_OTHER): Payer: BC Managed Care – PPO | Admitting: Internal Medicine

## 2023-09-26 VITALS — BP 138/78 | HR 54 | Ht 63.0 in | Wt 144.8 lb

## 2023-09-26 DIAGNOSIS — E785 Hyperlipidemia, unspecified: Secondary | ICD-10-CM

## 2023-09-26 DIAGNOSIS — E1165 Type 2 diabetes mellitus with hyperglycemia: Secondary | ICD-10-CM

## 2023-09-26 LAB — MICROALBUMIN / CREATININE URINE RATIO
Creatinine,U: 228.2 mg/dL
Microalb Creat Ratio: 0.4 mg/g (ref 0.0–30.0)
Microalb, Ur: 1 mg/dL (ref 0.0–1.9)

## 2023-09-26 NOTE — Patient Instructions (Addendum)
Continue off diabetic medications.  Please return in 3 months.

## 2023-09-26 NOTE — Progress Notes (Signed)
Patient ID: Latondra Magel, female   DOB: 02-Jan-1951, 72 y.o.   MRN: [id number]   HPI: Lovelle Hausknecht is a 72 y.o.-year-old female, initially referred by her gastroenterologist, Dr. Loreta Ave, presenting for follow-up for DM2, dx ~2009, now insulin-dependent, fairly well controlled, without long-term complications.  Last visit 11 months ago.  Interim history: No increased urination, blurry vision, nausea, chest pain. She continues on a plant-based diet.  She reduced dietary indiscretions since last visit.  Reviewed history: In the past, she participated in [employer]), she adopted a whole food, no oil, plant-based diet and also started exercise (walking 4 to 7 miles daily with her husband).  Her sugars improved significantly and she was able to reduce Metformin and even stop this completely afterwards.  However, sugars worsened afterwards.  Reviewed HbA1c levels: 08/06/2023: HbA1c 8.5% Lab Results  Component Value Date   HGBA1C 8.1 (A) 08/01/2023   HGBA1C 5.9 (A) 03/25/2020   HGBA1C 5.9 (A) 09/23/2019  09/06/2022: HbA1c 7.6% 12/21/2021: HbA1c 7.3% 08/21/2019: HbA1c 6.8% 05/02/2019: HbA1c 7.4% 02/2019: HbA1c 7.4% 06/12/2018 : HbA1c 7.1% 12/21/2017: HbA1c 6.9% 06/08/2017: HbA1c 6.6%  She recently had further investigation at [facility]:   She was previously on: - Metformin ER 1000 >> 2000 mg at bedtime >> 1000 mg with dinner >> stopped  She has a history of pancreatitis on 04/11/2018, possibly due to Januvia. She stopped Januvia at that time and also stopped Glucophage DAW 2000 mg daily and  Ezetimibe and Lovaza then.  We restarted these afterwards, but she stopped them the worse.  She checks her blood sugars >4x a day with her libre CGM:  Prev.: - am: 90's, 121 >> 116-134 with all test strips, 217/256 with new test tubes - 2h after b'fast: n/c >> 209 with new test strips - lunch: n/c >> 225 - 2h after lunch: 90-120 >> 183 - dinner: n/c >> 215, 223 - 2h after dinner: n/c >>   221 - bedtime: 90s >> see above  Lowest sugar was 49 (Libre) >> 85 >> 91 >> 90s , she has hypoglycemia awareness in the 32s.  Highest sugar was 218 >> 164 >> 121 >> 256 >> upper 200s.  -No CKD, last BUN/creatinine:   Lab Results  Component Value Date   BUN 9 01/30/2021   BUN 12 01/27/2021   CREATININE 0.90 08/06/2023   CREATININE 0.68 01/30/2021  No results found for: "MICRALBCREAT" Previously on losartan 50, now off.  -+ HL; last set of lipids:  No results found for: "CHOL", "HDL", "LDLCALC", "LDLDIRECT", "TRIG", "CHOLHDL"  Previously on Zetia and Lovaza, then on Livalo, now off.  Per history offered by patient: Exetimibe was discontinued by Dr. Judithann Sheen when cholesterol normalized with diet and Lovaza caused bradycardia into the high 30s.  - last eye exam was in 09/2022: No DR reportedly; + cataracts [facility] -reviewed Dr. Rexford Maus note)  - No numbness and tingling in her feet. Has a h/o plantar fasciitis.  Last foot exam 07/2023 here in clinic.  On ASA 81.  Pt has FH of DM in mother, father, brother.  She was a Engineer, civil (consulting) practitioner-retired.  ROS: + see HPI  I reviewed pt's medications, allergies, PMH, social hx, family hx, and changes were documented in the history of present illness. Otherwise, unchanged from my initial visit note.  Past Medical History:  Diagnosis Date   Allergy    Diabetes mellitus without complication (HCC) 2009?   diet controlled   Hx of pancreatitis 2019  Hypertension    Past Surgical History:  Procedure Laterality Date   ABDOMINAL HYSTERECTOMY  1996   Total   BREAST BIOPSY Left 1972, 1999   CESAREAN SECTION  1988   COLONOSCOPY  2018   HAND ARTHROPLASTY Bilateral 2014   Social History   Socioeconomic History   Marital status: Married    Spouse name: Not on file   Number of children: 4   Years of education: Not on file   Highest education level: Not on file  Occupational History   NP  Tobacco Use   Smoking status: Never  Smoker   Smokeless tobacco: Never Used  Substance and Sexual Activity   Alcohol use: Yes    Comment: wine occasionally, 1 drink   Drug use: No   Current Outpatient Medications  Medication Sig Dispense Refill   aspirin EC 81 MG tablet Take 81 mg by mouth daily.     Continuous Glucose Receiver (DEXCOM G7 RECEIVER) DEVI 1 Device by Does not apply route continuous. 1 each 0   Continuous Glucose Sensor (DEXCOM G7 SENSOR) MISC Use to check glucose continuously, change sensor every 10 days 9 each 3   Continuous Glucose Sensor (FREESTYLE LIBRE 2 SENSOR) MISC Use to monitor glucose continuously, change every 14 days. 6 each 3   Continuous Glucose Sensor (FREESTYLE LIBRE 3 SENSOR) MISC 1 each by Does not apply route every 14 (fourteen) days. 6 each 3   Cyanocobalamin (B-12 PO) Take by mouth.     estradiol (CLIMARA) 0.1 mg/24hr patch Place 1 patch onto the skin once a week.     insulin degludec (TRESIBA FLEXTOUCH) 100 UNIT/ML FlexTouch Pen Inject 12 Units into the skin at bedtime. 15 mL 0   Insulin Pen Needle 32G X 4 MM MISC Use 1x a day 100 each 3   losartan-hydrochlorothiazide (HYZAAR) 50-12.5 MG tablet Hyzaar 50 mg-12.5 mg tablet     Vitamin D, Ergocalciferol, (DRISDOL) 50000 units CAPS capsule      No current facility-administered medications for this visit.    Allergies  Allergen Reactions   Avelox [Moxifloxacin Hcl In Nacl] Nausea And Vomiting   Other Other (See Comments)    Pro Air Inhalers-produces Stridor  Floracarbons, hard to breathe  Pro Air Inhalers-produces Stridor Pro Air Inhalers-produces Stridor   Fluticasone Furoate-Vilanterol Palpitations   Macrobid [Nitrofurantoin Macrocrystal] Rash   Moxifloxacin Nausea And Vomiting   Pitavastatin Other (See Comments) and Palpitations    Bradycardia    Sulfa Antibiotics Rash   Family History  Problem Relation Age of Onset   Diabetes Mother    Diabetes Father    Gastric cancer Brother    PE: BP 138/78   Pulse (!) 54   Ht 5'  3" (1.6 m)   Wt 144 lb 12.8 oz (65.7 kg)   SpO2 97%   BMI 25.65 kg/m  Wt Readings from Last 3 Encounters:  09/26/23 144 lb 12.8 oz (65.7 kg)  08/01/23 144 lb 12.8 oz (65.7 kg)  05/18/23 152 lb 1.9 oz (69 kg)   Constitutional: normal weight, in NAD Eyes:  EOMI, no exophthalmos ENT: no neck masses, no cervical lymphadenopathy Cardiovascular: RRR, No MRG Respiratory: CTA B Musculoskeletal: no deformities Skin:no rashes Neurological: no tremor with outstretched hands.  ASSESSMENT: 1. DM2, now insulin-dependent, without long-term complications, but with hyperglycemia  2. HL  3.  H/o Overweight  PLAN:  1. Patient with longstanding, uncontrolled, type 2 diabetes, off all diabetic medications due to improved diet before visit in 2021.  She was previously on glipizide, metformin, Januvia.  After switching to whole food plant-based diet, HbA1c improved to 5.9% and sugars are at goal without medications.  However, HbA1c increased afterwards and at last visit this was 8.1%.  Sugars at home were at goal until she changed her test strips, few days prior to the appointment, after which the sugars appear to be higher, in the 200s.  This did correlate with the HbA1c in the clinic.  We discussed about starting the CGM but we also started insulin.  Of note, we cannot use a GLP-1 receptor agonist due to previous history of pancreatitis while on Januvia and also side effects to injectable medications in the class. -After that visit, she had labs at [facility]: C-peptide was normal and GAD antibodies were undetectable -She contacted me since last visit with low blood sugars so we decreased and then stopped Tresiba CGM interpretation: -At today's visit, we reviewed her CGM downloads: It appears that 91% of values are in target range (goal >70%), while 9% are higher than 180 (goal <25%), and 0% are lower than 70 (goal <4%).  The calculated average blood sugar is 138.  The projected HbA1c for the next 3 months (GMI)  is 6.6%. -Reviewing the CGM trends, sugars are significantly improved, fluctuating within the target range with only occasional hyperglycemic spikes after high glycemic index foods.  We discussed about reducing these, but otherwise, I would not suggest a change in regimen.  She remains off insulin and as of now I do not feel she needs other antidiabetic regimen.  She did not improve her diet since last visit, by cutting down bread and stopping chocolate cake and she also is exercising consistently-walking a 5K every day.  Staying on the CGM is also imperative for her.  She was switched to a Dexcom G7 CGM as she feels that her Josephine Igo is giving her errors.  We gave her receiver and she prefers to check with this, rather than her phone. - I suggested to:  Patient Instructions  Continue off diabetic medications.  Please return in 3 months.  - advised to check sugars at different times of the day - 4x a day, rotating check times - advised for yearly eye exams >> she is UTD - return to clinic in 3 months  2. HL - Reviewed latest lipid panel from 07/2023: LDL elevated, above 100, otherwise fractions at goal:  No results found for: "CHOL", "HDL", "LDLCALC", "LDLDIRECT", "TRIG", "CHOLHDL" - she is not on cholesterol medication (see HPI)  3.  H/o Overweight -She previously lost a significant amount of weight before visit in 2021 due to consistent exercise and especially improving diet -At last visit, she lost 8 pounds since the previous visit, possibly in the setting of glucotoxicity -Weight is stable at today's visit, after she started exercising and reduced dietary indiscretions (chocolate cake, bread)  Carlus Pavlov, MD PhD [facility] Endocrinology

## 2023-10-01 ENCOUNTER — Encounter: Payer: Self-pay | Admitting: Internal Medicine

## 2023-10-01 ENCOUNTER — Other Ambulatory Visit: Payer: Self-pay | Admitting: Cardiology

## 2023-10-01 DIAGNOSIS — I10 Essential (primary) hypertension: Secondary | ICD-10-CM

## 2023-10-01 DIAGNOSIS — R0609 Other forms of dyspnea: Secondary | ICD-10-CM

## 2023-10-01 DIAGNOSIS — E119 Type 2 diabetes mellitus without complications: Secondary | ICD-10-CM

## 2023-10-01 DIAGNOSIS — R0789 Other chest pain: Secondary | ICD-10-CM

## 2023-10-01 DIAGNOSIS — E1165 Type 2 diabetes mellitus with hyperglycemia: Secondary | ICD-10-CM

## 2023-10-01 DIAGNOSIS — R0602 Shortness of breath: Secondary | ICD-10-CM

## 2023-10-01 MED ORDER — DEXCOM G7 SENSOR MISC
4 refills | Status: DC
Start: 2023-10-01 — End: 2024-01-03

## 2023-10-02 ENCOUNTER — Other Ambulatory Visit (HOSPITAL_COMMUNITY): Payer: Self-pay

## 2023-10-02 ENCOUNTER — Encounter (HOSPITAL_COMMUNITY): Payer: Self-pay

## 2023-10-03 ENCOUNTER — Ambulatory Visit
Admission: RE | Admit: 2023-10-03 | Discharge: 2023-10-03 | Disposition: A | Discharge status: Home / Self Care | Payer: BC Managed Care – PPO | Source: Ambulatory Visit | Attending: Cardiology | Admitting: Cardiology

## 2023-10-03 DIAGNOSIS — R0602 Shortness of breath: Secondary | ICD-10-CM | POA: Diagnosis present

## 2023-10-03 DIAGNOSIS — R0789 Other chest pain: Secondary | ICD-10-CM | POA: Insufficient documentation

## 2023-10-03 DIAGNOSIS — E119 Type 2 diabetes mellitus without complications: Secondary | ICD-10-CM | POA: Insufficient documentation

## 2023-10-03 DIAGNOSIS — I10 Essential (primary) hypertension: Secondary | ICD-10-CM | POA: Diagnosis present

## 2023-10-03 DIAGNOSIS — R0609 Other forms of dyspnea: Secondary | ICD-10-CM | POA: Diagnosis present

## 2023-10-03 LAB — POCT I-STAT CREATININE: Creatinine, Ser: 0.8 mg/dL (ref 0.44–1.00)

## 2023-10-03 MED ORDER — IOHEXOL 350 MG/ML SOLN
80.0000 mL | Freq: Once | INTRAVENOUS | Status: AC | PRN
  Administered 2023-10-03: 80 mL via INTRAVENOUS

## 2023-10-03 MED ORDER — NITROGLYCERIN 0.4 MG SL SUBL
0.8000 mg | SUBLINGUAL_TABLET | Freq: Once | SUBLINGUAL | Status: AC
  Administered 2023-10-03: 0.8 mg via SUBLINGUAL
  Filled 2023-10-03: qty 25

## 2023-10-03 NOTE — Progress Notes (Signed)
Pt tolerated procedure well with no issues. Pt ABCs intact. Pt denies any complaints. Pt encouraged to drink plenty of water throughout the day. Pt ambulatory with steady gait.

## 2023-10-05 ENCOUNTER — Encounter: Payer: Self-pay | Admitting: Internal Medicine

## 2023-10-06 ENCOUNTER — Encounter: Payer: Self-pay | Admitting: Internal Medicine

## 2023-10-06 DIAGNOSIS — E1165 Type 2 diabetes mellitus with hyperglycemia: Secondary | ICD-10-CM

## 2023-10-09 MED ORDER — METFORMIN HCL 500 MG PO TABS: 1000.0000 mg | ORAL_TABLET | Freq: Every day | ORAL | 3 refills | Status: DC

## 2023-10-10 MED ORDER — METFORMIN HCL ER 500 MG PO TB24: 1000.0000 mg | ORAL_TABLET | Freq: Every day | ORAL | 3 refills | Status: DC

## 2023-10-10 NOTE — Addendum Note (Signed)
Addended by: Pollie Meyer on: 10/10/2023 09:16 AM   Modules accepted: Orders

## 2023-12-07 ENCOUNTER — Encounter: Payer: Self-pay | Admitting: Internal Medicine

## 2023-12-20 ENCOUNTER — Encounter: Payer: Self-pay | Admitting: Internal Medicine

## 2023-12-23 ENCOUNTER — Encounter: Payer: Self-pay | Admitting: Internal Medicine

## 2023-12-25 ENCOUNTER — Ambulatory Visit: Payer: BC Managed Care – PPO | Admitting: Internal Medicine

## 2023-12-28 ENCOUNTER — Other Ambulatory Visit: Payer: Self-pay | Admitting: Internal Medicine

## 2023-12-28 ENCOUNTER — Ambulatory Visit
Admission: RE | Admit: 2023-12-28 | Discharge: 2023-12-28 | Disposition: A | Discharge status: Home / Self Care | Payer: BC Managed Care – PPO | Source: Ambulatory Visit | Attending: Internal Medicine | Admitting: Internal Medicine

## 2023-12-28 DIAGNOSIS — K8500 Idiopathic acute pancreatitis without necrosis or infection: Secondary | ICD-10-CM | POA: Insufficient documentation

## 2023-12-28 MED ORDER — IOHEXOL 300 MG/ML  SOLN
100.0000 mL | Freq: Once | INTRAMUSCULAR | Status: AC | PRN
  Administered 2023-12-28: 100 mL via INTRAVENOUS

## 2024-01-02 NOTE — Progress Notes (Signed)
 Patient ID: Kim Lowery, female   DOB: Jan 25, 1951, 73 y.o.   MRN: [id number]   HPI: Kim Lowery is a 73 y.o.-year-old female, initially referred by her gastroenterologist, Dr. Kristie, presenting for follow-up for DM2, dx ~2009, now insulin -dependent, fairly well controlled, without long-term complications except for recurrent pancreatitis.  Last visit 11 months ago.  Interim history: No increased urination, blurry vision, nausea, chest pain. She was hospitalized 71/26/2025 at [facility] with ? recurrent pancreatitis (but possibly only constipation). She currently sees Dr. Kristie - started [facility] and [facility].  Reviewed history: In the past, she participated in [employer] she adopted a whole food, no oil, plant-based diet and also started exercise (walking 4 to 7 miles daily with her husband).  Her sugars improved significantly and she was able to reduce Metformin  and even stop this completely afterwards.    Reviewed HbA1c levels: 08/06/2023: HbA1c 8.5% Lab Results  Component Value Date   HGBA1C 8.1 (A) 08/01/2023   HGBA1C 5.9 (A) 03/25/2020   HGBA1C 5.9 (A) 09/23/2019  09/06/2022: HbA1c 7.6% 12/21/2021: HbA1c 7.3% 08/21/2019: HbA1c 6.8% 05/02/2019: HbA1c 7.4% 02/2019: HbA1c 7.4% 06/12/2018 : HbA1c 7.1% 12/21/2017: HbA1c 6.9% 06/08/2017: HbA1c 6.6%  She was previously on: - Metformin  ER 1000 >> 2000 mg at bedtime >> 1000 mg with dinner >> off  She has a history of pancreatitis on 03/2018, possibly due to Januvia. She stopped Januvia at that time and also stopped Glucophage  DAW 2000 mg daily and  Ezetimibe and Lovaza then.   She had another episode of ?pancreatitis 11/2023.  At that time, she had abdominal pain and significant constipation.   She checks her blood sugars >4x a day with her libre CGM:  Previously:  Lowest sugar was 49 (Libre) >> ... 90s , she has hypoglycemia awareness in the 25s.  Highest sugar was 256 >> upper 200s.  -No CKD, last BUN/creatinine:    Lab Results  Component Value Date   BUN 9 01/30/2021   BUN 12 01/27/2021   CREATININE 0.80 10/03/2023   CREATININE 0.90 08/06/2023   Lab Results  Component Value Date   MICRALBCREAT 0.4 09/26/2023   Previously on losartan  50, now off.  -+ HL; last set of lipids:  No results found for: CHOL, HDL, LDLCALC, LDLDIRECT, TRIG, CHOLHDL  Previously on Zetia and Lovaza, then on Livalo , now off.  Per history offered by patient: Exetimibe was discontinued by Dr. Auston when cholesterol normalized with diet and Lovaza caused bradycardia into the high 30s.  - last eye exam was 10/12/2023: No DR; + cataracts [facility] - Dr. Dain note)  - No numbness and tingling in her feet. Has a h/o plantar fasciitis.  Last foot exam 07/2023 here in clinic.  Pt has FH of DM in mother, father, brother.  She was a engineer, civil (consulting) practitioner-retired.  ROS: + see HPI  I reviewed pt's medications, allergies, PMH, social hx, family hx, and changes were documented in the history of present illness. Otherwise, unchanged from my initial visit note.  Past Medical History:  Diagnosis Date   Allergy    Diabetes mellitus without complication (HCC) 2009?   diet controlled   Hx of pancreatitis 2019   Hypertension    Past Surgical History:  Procedure Laterality Date   ABDOMINAL HYSTERECTOMY  1996   Total   BREAST BIOPSY Left 1972, 1999   CESAREAN SECTION  1988   COLONOSCOPY  2018   HAND ARTHROPLASTY Bilateral 2014   Social History   Socioeconomic History  Marital status: Married    Spouse name: Not on file   Number of children: 4   Years of education: Not on file   Highest education level: Not on file  Occupational History   NP  Tobacco Use   Smoking status: Never Smoker   Smokeless tobacco: Never Used  Substance and Sexual Activity   Alcohol use: Yes    Comment: wine occasionally, 1 drink   Drug use: No   Current Outpatient Medications  Medication Sig Dispense Refill    aspirin EC 81 MG tablet Take 81 mg by mouth daily.     Continuous Glucose Receiver (DEXCOM G7 RECEIVER) DEVI 1 Device by Does not apply route continuous. 1 each 0   Continuous Glucose Sensor (DEXCOM G7 SENSOR) MISC Use to check glucose continuously, change sensor every 10 days 9 each 4   Cyanocobalamin (B-12 PO) Take by mouth.     estradiol (CLIMARA) 0.1 mg/24hr patch Place 1 patch onto the skin once a week.     losartan -hydrochlorothiazide (HYZAAR) 50-12.5 MG tablet Hyzaar 50 mg-12.5 mg tablet     metFORMIN  (GLUCOPHAGE -XR) 500 MG 24 hr tablet Take 2 tablets (1,000 mg total) by mouth daily with supper. 120 tablet 3   Vitamin D, Ergocalciferol, (DRISDOL) 50000 units CAPS capsule      No current facility-administered medications for this visit.    Allergies  Allergen Reactions   Avelox [Moxifloxacin Hcl In Nacl] Nausea And Vomiting   Other Other (See Comments)    Pro Air Inhalers-produces Stridor  Floracarbons, hard to breathe  Pro Air Inhalers-produces Stridor Pro Air Inhalers-produces Stridor   Fluticasone Furoate-Vilanterol Palpitations   Macrobid [Nitrofurantoin Macrocrystal] Rash   Moxifloxacin Nausea And Vomiting   Pitavastatin  Other (See Comments) and Palpitations    Bradycardia    Sulfa Antibiotics Rash   Family History  Problem Relation Age of Onset   Diabetes Mother    Diabetes Father    Gastric cancer Brother    PE: There were no vitals taken for this visit. Wt Readings from Last 3 Encounters:  09/26/23 144 lb 12.8 oz (65.7 kg)  08/01/23 144 lb 12.8 oz (65.7 kg)  05/18/23 152 lb 1.9 oz (69 kg)   Constitutional: normal weight, in NAD Eyes:  EOMI, no exophthalmos ENT: no neck masses, no cervical lymphadenopathy Cardiovascular: RRR, No MRG Respiratory: CTA B Musculoskeletal: no deformities Skin:no rashes Neurological: no tremor with outstretched hands.  ASSESSMENT: 1. DM2, off and on insulin , without long-term complications, but with hyperglycemia -  Recurrent pancreatitis     2. HL  3.  H/o Overweight -Remains on a mostly plant-based diet   PLAN:  1. Patient with longstanding, uncontrolled, type 2 diabetes, off all diabetic medications after improving diet before our visit in 2021.  She was previously on glipizide , metformin , Januvia.  After switching to a whole food plant-based diet, her HbA1c improved to 5.9% and sugars were at goal without medications.  However, HbA1c increased afterwards to 8.1% and then to 8.5% in 07/2023.  At that time, we added back metformin , but she came off afterwards due to low blood sugars.  She was previously on Tresiba , which she was also able to come off.  Of note, a C-peptide was normal and GAD antibodies were undetectable when checked at [facility] in 07/2023. -Of note, we cannot use a GLP-1 receptor agonist due to previous history of pancreatitis while on Januvia and also side effects to injectable medications in the class. -Since last visit, she was admitted  with pancreatitis last month CGM interpretation: -At today's visit, we reviewed her CGM downloads: It appears that 96% of values are in target range (goal >70%), while 4% are higher than 180 (goal <25%), and 0% are lower than 70 (goal <4%).  The calculated average blood sugar is 129.  The projected HbA1c for the next 3 months (GMI) is 6.4%. -Reviewing the CGM trends, sugars are very well controlled, with only occasional mildly high glycemic values after breakfast but more frequently after lunch.  However, with the vast majority of the blood sugars being at goal, it does not appear that she requires diabetic medications for now.  Her HbA1c is also significantly improved from last visit (see below). -She does reports that on metformin  sugars were trending down more frequently and more abruptly when exercising, but even in the absence of activity.  For now, we will continue off metformin .  We did discuss about possible situations in which the sensor can give her  erroneous lower values: For example compression lows in 12 hours after changing the sensor.  We discussed about calibrating the sensor after attaching it. - I suggested to:  Patient Instructions  Continue off diabetic medications.  Please return in 3 months.  - we checked her HbA1c: 6.9% (lower) - advised to check sugars at different times of the day - 4x a day, rotating check times - advised for yearly eye exams >> she is UTD - return to clinic in 3 months  2. HL -Platelet lipid panel was reviewed from 07/2023: Fractions at goal with exception of an elevated LDL:  No results found for: CHOL, HDL, LDLCALC, LDLDIRECT, TRIG, CHOLHDL -She is not on cholesterol medications (see HPI); she is on a mostly plant-based diet  Lela Fendt, MD PhD [facility] Endocrinology

## 2024-01-03 ENCOUNTER — Ambulatory Visit: Payer: BC Managed Care – PPO | Admitting: Internal Medicine

## 2024-01-03 ENCOUNTER — Encounter: Payer: Self-pay | Admitting: Internal Medicine

## 2024-01-03 ENCOUNTER — Telehealth: Payer: Self-pay

## 2024-01-03 VITALS — BP 120/60 | HR 50 | Ht 63.0 in | Wt 146.6 lb

## 2024-01-03 DIAGNOSIS — E1165 Type 2 diabetes mellitus with hyperglycemia: Secondary | ICD-10-CM

## 2024-01-03 DIAGNOSIS — E785 Hyperlipidemia, unspecified: Secondary | ICD-10-CM | POA: Diagnosis not present

## 2024-01-03 LAB — POCT GLYCOSYLATED HEMOGLOBIN (HGB A1C): Hemoglobin A1C: 6.9 % — AB (ref 4.0–5.6)

## 2024-01-03 MED ORDER — DEXCOM G7 SENSOR MISC: 4 refills | Status: DC

## 2024-01-03 NOTE — Patient Instructions (Signed)
 Please continue off diabetic medications.  Please return in 3 months.

## 2024-01-03 NOTE — Telephone Encounter (Signed)
 Requested Prescriptions   Signed Prescriptions Disp Refills   Continuous Glucose Sensor (DEXCOM G7 SENSOR) MISC 9 each 4    Sig: Use to check glucose continuously, change sensor every 10 days    Authorizing Provider: TRIXIE FILE    Ordering User: CLEOTILDE ROLIN RAMAN

## 2024-01-19 ENCOUNTER — Encounter: Payer: Self-pay | Admitting: Internal Medicine

## 2024-01-21 MED ORDER — METFORMIN HCL ER 500 MG PO TB24: 1000.0000 mg | ORAL_TABLET | Freq: Every day | ORAL | 3 refills | Status: DC

## 2024-01-30 ENCOUNTER — Other Ambulatory Visit: Payer: Self-pay

## 2024-01-30 ENCOUNTER — Emergency Department
Admission: EM | Admit: 2024-01-30 | Discharge: 2024-01-30 | Disposition: A | Discharge status: Home / Self Care | Attending: Emergency Medicine | Admitting: Emergency Medicine

## 2024-01-30 DIAGNOSIS — E119 Type 2 diabetes mellitus without complications: Secondary | ICD-10-CM | POA: Insufficient documentation

## 2024-01-30 DIAGNOSIS — R002 Palpitations: Secondary | ICD-10-CM | POA: Diagnosis present

## 2024-01-30 LAB — CBC WITH DIFFERENTIAL/PLATELET
Abs Immature Granulocytes: 0.02 10*3/uL (ref 0.00–0.07)
Basophils Absolute: 0.1 10*3/uL (ref 0.0–0.1)
Basophils Relative: 2 %
Eosinophils Absolute: 0.2 10*3/uL (ref 0.0–0.5)
Eosinophils Relative: 3 %
HCT: 37.8 % (ref 36.0–46.0)
Hemoglobin: 12.5 g/dL (ref 12.0–15.0)
Immature Granulocytes: 0 %
Lymphocytes Relative: 36 %
Lymphs Abs: 2.2 10*3/uL (ref 0.7–4.0)
MCH: 30.1 pg (ref 26.0–34.0)
MCHC: 33.1 g/dL (ref 30.0–36.0)
MCV: 91.1 fL (ref 80.0–100.0)
Monocytes Absolute: 0.5 10*3/uL (ref 0.1–1.0)
Monocytes Relative: 8 %
Neutro Abs: 3.2 10*3/uL (ref 1.7–7.7)
Neutrophils Relative %: 51 %
Platelets: 223 10*3/uL (ref 150–400)
RBC: 4.15 MIL/uL (ref 3.87–5.11)
RDW: 14.3 % (ref 11.5–15.5)
WBC: 6.2 10*3/uL (ref 4.0–10.5)
nRBC: 0 % (ref 0.0–0.2)

## 2024-01-30 LAB — BASIC METABOLIC PANEL
Anion gap: 9 (ref 5–15)
BUN: 15 mg/dL (ref 8–23)
CO2: 26 mmol/L (ref 22–32)
Calcium: 9.1 mg/dL (ref 8.9–10.3)
Chloride: 99 mmol/L (ref 98–111)
Creatinine, Ser: 0.69 mg/dL (ref 0.44–1.00)
GFR, Estimated: >60 mL/min (ref >60)
Glucose, Bld: 125 mg/dL — ABNORMAL HIGH (ref 70–99)
Potassium: 3.4 mmol/L — ABNORMAL LOW (ref 3.5–5.1)
Sodium: 134 mmol/L — ABNORMAL LOW (ref 135–145)

## 2024-01-30 LAB — MAGNESIUM: Magnesium: 2 mg/dL (ref 1.7–2.4)

## 2024-01-30 LAB — TROPONIN I (HIGH SENSITIVITY): Troponin I (High Sensitivity): 4 ng/L (ref <18)

## 2024-01-30 MED ORDER — ONDANSETRON HCL 4 MG/2ML IJ SOLN
4.0000 mg | INTRAMUSCULAR | Status: AC
  Administered 2024-01-30: 4 mg via INTRAVENOUS
  Filled 2024-01-30: qty 2

## 2024-01-30 NOTE — Discharge Instructions (Addendum)
 Your workup in the Emergency Department today was reassuring.  We did not find any specific abnormalities.  We recommend you drink plenty of fluids, take your regular medications and/or any new ones prescribed today, and follow up with the doctor(s) listed in these documents as recommended.  Return to the Emergency Department if you develop new or worsening symptoms that concern you.

## 2024-01-30 NOTE — ED Provider Notes (Signed)
 [facility] Provider Note    Event Date/Time   First MD Initiated Contact with Patient 01/30/24 0157     (approximate)   History   Palpitations   HPI Kim Lowery is a 73 y.o. female   who presents for evaluation of acute onset palpitations.  She states that she has been evaluated for palpitations by cardiology and in the emergency department in the past.  She has had 2 rounds of wearing a Holter monitor and has had recent follow-up with cardiology.  She has had a stress test and cardiac CT.  All of the results have been reassuring, but they have not been able to identify why she occasionally has episodes of palpitations.  Her typical resting heart rate is in the 40s, and she is very active, typically walking 4 to 5 km/day.  She is vegan and manages her diabetes and hyperlipidemia with medication and dietary modification.  She reports that some nights she was asleep and woke up feeling short of breath with rapid heart rate, greater than 100 bpm.  This scared her because she does not know why it is happening and she has been told that if it happens again she should go to the emergency department.  She has been asymptomatic since coming to the ED.  She was initially nauseated but feels better after Zofran.  She had no vomiting.  She denies abdominal pain and any persistent other symptoms.      Physical Exam   Triage Vital Signs: ED Triage Vitals  Encounter Vitals Group     BP 01/30/24 0152 (!) 167/70     Systolic BP Percentile --      Diastolic BP Percentile --      Pulse Rate 01/30/24 0152 80     Resp 01/30/24 0152 20     Temp 01/30/24 0152 97.7 F (36.5 C)     Temp Source 01/30/24 0152 Oral     SpO2 01/30/24 0152 100 %     Weight 01/30/24 0151 63.5 kg (140 lb)     Height 01/30/24 0151 1.6 m (5\' 3" )     Head Circumference --      Peak Flow --      Pain Score 01/30/24 0151 0     Pain Loc --      Pain Education --      Exclude from Growth Chart --      Most recent vital signs: Vitals:   01/30/24 0415 01/30/24 0430  BP:  (!) 146/70  Pulse: (!) 57 (!) 55  Resp: 17 18  Temp:    SpO2: 97% 96%    General: Awake, no distress.  Appears much younger than chronological age and is overall well-appearing. CV:  Good peripheral perfusion.  Bradycardia, regular rhythm, normal heart sounds. Resp:  Normal effort. Speaking easily and comfortably, no accessory muscle usage nor intercostal retractions.  Lungs are clear to auscultation bilaterally. Abd:  No distention.  Other:  Pleasant, conversant, normal mood and affect.   ED Results / Procedures / Treatments   Labs (all labs ordered are listed, but only abnormal results are displayed) Labs Reviewed  BASIC METABOLIC PANEL - Abnormal; Notable for the following components:      Result Value   Sodium 134 (*)    Potassium 3.4 (*)    Glucose, Bld 125 (*)    All other components within normal limits  MAGNESIUM  CBC WITH DIFFERENTIAL/PLATELET  TROPONIN I (HIGH SENSITIVITY)  EKG  ED ECG REPORT I, Loleta Rose, the attending physician, personally viewed and interpreted this ECG.  Date: 01/30/2024 EKG Time: 1:54 AM Rate: 80 Rhythm: Normal sinus rhythm with sinus arrhythmia QRS Axis: normal Intervals: normal ST/T Wave abnormalities: Non-specific ST segment / T-wave changes, but no clear evidence of acute ischemia. Narrative Interpretation: no definitive evidence of acute ischemia; does not meet STEMI criteria.   PROCEDURES:  Critical Care performed: No  .1-3 Lead EKG Interpretation  Performed by: Loleta Rose, MD Authorized by: Loleta Rose, MD     Interpretation: abnormal     ECG rate:  55   ECG rate assessment: bradycardic     Rhythm: sinus bradycardia     Ectopy: none     Conduction: normal       IMPRESSION / MDM / ASSESSMENT AND PLAN / ED COURSE  I reviewed the triage vital signs and the nursing notes.                              Differential diagnosis  includes, but is not limited to, nonspecific palpitations, SVT, AVNRT, electrolyte or metabolic abnormality, ACS, PE.  Patient's presentation is most consistent with acute presentation with potential threat to life or bodily function.  Labs/studies ordered: High-sensitivity troponin, magnesium, CBC with differential, BMP, EKG  Interventions/Medications given:  Medications  ondansetron (ZOFRAN) injection 4 mg (4 mg Intravenous Given 01/30/24 0318)    (Note:  hospital course my include additional interventions and/or labs/studies not listed above.)   Vital signs are very reassuring and appropriate for her as she says her usual heart rate is bradycardic.  She has no ongoing symptoms and her evaluation is all reassuring.  I reviewed her medical record including her prior cardiac CT showing only a small bit of atherosclerotic disease and 1 blood vessel which was not at all worrisome to the cardiologist.  I also saw the cardiology notes from her recent clinic visit.  She is at very low risk for ACS and her well score for PE is 0.  This is not a new issue for her and her evaluation is very reassuring tonight.  She is comfortable with the plan for discharge and does not need or want a second troponin.  The patient's medical screening exam is reassuring with no indication of an emergent medical condition requiring hospitalization or additional evaluation at this point.  The patient is safe and appropriate for discharge and outpatient follow up.  The patient was on the cardiac monitor to evaluate for evidence of arrhythmia and/or significant heart rate changes.       FINAL CLINICAL IMPRESSION(S) / ED DIAGNOSES   Final diagnoses:  Palpitations     Rx / DC Orders   ED Discharge Orders     None        Note:  This document was prepared using Dragon voice recognition software and may include unintentional dictation errors.   Loleta Rose, MD 01/30/24 [phone]

## 2024-01-30 NOTE — ED Triage Notes (Signed)
 Pt reports waking up from her sleep short of breath and having palpitations, pt reports same symptoms recently and had been wearing heart monitor that she turned into cardiologist yesterday. Pt denies chest pain. Pt denies cough congestion fever.

## 2024-02-12 ENCOUNTER — Encounter: Payer: Self-pay | Admitting: Internal Medicine

## 2024-03-31 ENCOUNTER — Telehealth: Payer: Self-pay

## 2024-03-31 DIAGNOSIS — E1165 Type 2 diabetes mellitus with hyperglycemia: Secondary | ICD-10-CM

## 2024-03-31 MED ORDER — DEXCOM G7 SENSOR MISC: 4 refills | Status: AC

## 2024-03-31 NOTE — Telephone Encounter (Signed)
 Received a fax from [facility] stating that the CGM is on back order and so I have [facility] it to [facility]

## 2024-05-07 ENCOUNTER — Encounter: Payer: Self-pay | Admitting: Internal Medicine

## 2024-05-14 ENCOUNTER — Ambulatory Visit (INDEPENDENT_AMBULATORY_CARE_PROVIDER_SITE_OTHER): Payer: BC Managed Care – PPO | Admitting: Internal Medicine

## 2024-05-14 ENCOUNTER — Encounter: Payer: Self-pay | Admitting: Internal Medicine

## 2024-05-14 VITALS — BP 118/70 | HR 58 | Ht 63.0 in | Wt 149.2 lb

## 2024-05-14 DIAGNOSIS — E785 Hyperlipidemia, unspecified: Secondary | ICD-10-CM

## 2024-05-14 DIAGNOSIS — E1165 Type 2 diabetes mellitus with hyperglycemia: Secondary | ICD-10-CM | POA: Diagnosis not present

## 2024-05-14 DIAGNOSIS — Z7984 Long term (current) use of oral hypoglycemic drugs: Secondary | ICD-10-CM | POA: Diagnosis not present

## 2024-05-14 LAB — POCT GLYCOSYLATED HEMOGLOBIN (HGB A1C): Hemoglobin A1C: 6.7 % — AB (ref 4.0–5.6)

## 2024-05-14 NOTE — Progress Notes (Signed)
 Patient ID: Kim Lowery, female   DOB: December 16, 1950, 73 y.o.   MRN: [id number]   HPI: Kim Lowery is a 73 y.o.-year-old female, initially referred by her gastroenterologist, Dr. Tova Fresh, presenting for follow-up for DM2, dx ~2009, insulin -independent, fairly well controlled, without long-term complications except for pancreatitis.  Last visit 4 months ago.  Interim history: No increased urination, blurry vision, nausea, chest pain. She was in the emergency room with palpitations 01/2024.  She sees cardiology. The palpitations were presumed due to Trulance (for constipation) >> palpitations resolved. Constipation resolved on probiotics. She started back on metformin  since last visit that sugars increase slightly  Reviewed and addended history: In the past, she participated in [employer] she adopted a whole food, no oil, plant-based diet and also started exercise (walking 4 to 7 miles daily with her husband).  Her sugars improved significantly and she was able to reduce Metformin  and even stop this completely afterwards.  However, she had to restart afterwards.  Reviewed HbA1c levels: Lab Results  Component Value Date   HGBA1C 6.9 (A) 01/03/2024   HGBA1C 8.1 (A) 08/01/2023   HGBA1C 5.9 (A) 03/25/2020  08/06/2023: HbA1c 8.5% 09/06/2022: HbA1c 7.6% 12/21/2021: HbA1c 7.3% 08/21/2019: HbA1c 6.8% 05/02/2019: HbA1c 7.4% 02/2019: HbA1c 7.4% 06/12/2018 : HbA1c 7.1% 12/21/2017: HbA1c 6.9% 06/08/2017: HbA1c 6.6%  She was previously on: - Metformin  ER 1000 >> 2000 mg at bedtime >> 1000 mg with dinner >> off >> now 500 mg in am and 0-1x 500 mg in am  She has a history of pancreatitis on 03/2018, possibly due to Januvia. She stopped Januvia at that time and also stopped Glucophage  DAW 2000 mg daily and  Ezetimibe and Lovaza then.   She was hospitalized 12/23/2023 at [facility] with ? recurrent pancreatitis (but possibly only constipation). She sees Dr. Tova Fresh - on Linzess and  Trulance.  She checks her blood sugars >4x a day with her libre CGM:  Previously:  Previously:  Lowest sugar was 49 (Libre) >> ... 90s >> 33, she has hypoglycemia awareness in the 34s.  Highest sugar was 256 >> upper 200s >> 238.  -No CKD, last BUN/creatinine:  Lab Results  Component Value Date   BUN 15 01/30/2024   BUN 9 01/30/2021   CREATININE 0.69 01/30/2024   CREATININE 0.80 10/03/2023   Lab Results  Component Value Date   MICRALBCREAT 0.4 09/26/2023  Previously on losartan  50, now off.  -+ HL; last set of lipids:  No results found for: CHOL, HDL, LDLCALC, LDLDIRECT, TRIG, CHOLHDL  Previously on Zetia and Lovaza, then on Livalo , now off.  Per history offered by patient: Exetimibe was discontinued by Dr. Claudius Cumins when cholesterol normalized with diet and Lovaza caused bradycardia into the high 30s.  - last eye exam was 10/12/2023: No DR; + cataracts [facility] - Dr. Awanda Bogaert note)  - No numbness and tingling in her feet. Has a h/o plantar fasciitis.  Last foot exam 07/2023 here in clinic.  Pt has FH of DM in mother, father, brother.  She was a Engineer, civil (consulting) practitioner-retired.  ROS: + see HPI  I reviewed pt's medications, allergies, PMH, social hx, family hx, and changes were documented in the history of present illness. Otherwise, unchanged from my initial visit note.  Past Medical History:  Diagnosis Date   Allergy    Diabetes mellitus without complication (HCC) 2009?   diet controlled   Hx of pancreatitis 2019   Hypertension    Past Surgical History:  Procedure Laterality Date  ABDOMINAL HYSTERECTOMY  1996   Total   BREAST BIOPSY Left 1972, 1999   CESAREAN SECTION  1988   COLONOSCOPY  2018   HAND ARTHROPLASTY Bilateral 2014   Social History   Socioeconomic History   Marital status: Married    Spouse name: Not on file   Number of children: 4   Years of education: Not on file   Highest education level: Not on file  Occupational  History   NP  Tobacco Use   Smoking status: Never Smoker   Smokeless tobacco: Never Used  Substance and Sexual Activity   Alcohol use: Yes    Comment: wine occasionally, 1 drink   Drug use: No   Current Outpatient Medications  Medication Sig Dispense Refill   aspirin EC 81 MG tablet Take 81 mg by mouth daily.     Continuous Glucose Receiver (DEXCOM G7 RECEIVER) DEVI 1 Device by Does not apply route continuous. 1 each 0   Continuous Glucose Sensor (DEXCOM G7 SENSOR) MISC Use to check glucose continuously, change sensor every 10 days 9 each 4   Cyanocobalamin (B-12 PO) Take by mouth.     estradiol (CLIMARA) 0.1 mg/24hr patch Place 1 patch onto the skin once a week.     losartan -hydrochlorothiazide (HYZAAR) 50-12.5 MG tablet Hyzaar 50 mg-12.5 mg tablet     metFORMIN  (GLUCOPHAGE -XR) 500 MG 24 hr tablet Take 2 tablets (1,000 mg total) by mouth daily with supper. 180 tablet 3   Vitamin D, Ergocalciferol, (DRISDOL) 50000 units CAPS capsule      No current facility-administered medications for this visit.    Allergies  Allergen Reactions   Avelox [Moxifloxacin Hcl In Nacl] Nausea And Vomiting   Other Other (See Comments)    Pro Air Inhalers-produces Stridor  Floracarbons, hard to breathe  Pro Air Inhalers-produces Stridor Pro Air Inhalers-produces Stridor   Fluticasone Furoate-Vilanterol Palpitations   Macrobid [Nitrofurantoin Macrocrystal] Rash   Moxifloxacin Nausea And Vomiting   Pitavastatin  Other (See Comments) and Palpitations    Bradycardia    Sulfa Antibiotics Rash   Family History  Problem Relation Age of Onset   Diabetes Mother    Diabetes Father    Gastric cancer Brother    PE: BP 118/70   Pulse (!) 58   Ht 5' 3 (1.6 m)   Wt 149 lb 3.2 oz (67.7 kg)   SpO2 95%   BMI 26.43 kg/m  Wt Readings from Last 3 Encounters:  05/14/24 149 lb 3.2 oz (67.7 kg)  01/30/24 140 lb (63.5 kg)  01/03/24 146 lb 9.6 oz (66.5 kg)   Constitutional: normal weight, in NAD Eyes:   EOMI, no exophthalmos ENT: no neck masses, no cervical lymphadenopathy Cardiovascular: RRR, No MRG Respiratory: CTA B Musculoskeletal: no deformities Skin:no rashes Neurological: no tremor with outstretched hands. Diabetic Foot Exam - Simple   Simple Foot Form Diabetic Foot exam was performed with the following findings: Yes 05/14/2024  8:33 AM  Visual Inspection No deformities, no ulcerations, no other skin breakdown bilaterally: Yes Sensation Testing Intact to touch and monofilament testing bilaterally: Yes Pulse Check Posterior Tibialis and Dorsalis pulse intact bilaterally: Yes Comments    ASSESSMENT: 1. DM2, off and on insulin  (currently off), without long-term complications, but with hyperglycemia - Recurrent pancreatitis     2. HL  3.  H/o Overweight -Remains on a mostly plant-based diet   PLAN:  1. Patient with longstanding, uncontrolled, type 2 diabetes, previously on sulfonylurea, metformin , DPP 4 inhibitor but off all diabetic medications  after improving her diet before her visit in 2021 (whole food plant-based diet).  At that time, HbA1c improved to 5.9% and sugars were at goal without medications.  However, the HbA1c increased afterwards to 8.1% and then to 8.5% in 07/2023.  At that time, we added back metformin , but she developed low blood sugars after improving her diet again and she came off metformin .  She was previously on Tresiba , of which she was able to come off.  We cannot use GLP-1 receptor agonist for her due to previous history of pancreatitis and Januvia and side effects to injectable medications in the class.  She had another episode of pancreatitis in the months prior to our last appointment.  A C-peptide was normal and  GAD antibodies were undetectable-checked at [facility] 07/2023. - At last visit, reviewing the CGM trends, sugars were very well-controlled, with only occasional mildly hyperglycemic spikes after breakfast and more frequently after lunch.   However, the vast majority of the blood sugars were at goal so we did not start any antidiabetic regimen at that time.  She did report that metformin  was dropping her blood sugars too low especially when exercising but even in the absence of activity. -However, at today's visit, she tells me that she had to restart metformin  due to higher blood sugars.  She tolerates it well now, without lows. CGM interpretation: -At today's visit, we reviewed her CGM downloads: It appears that 94% of values are in target range (goal >70%), while 6% are higher than 180 (goal <25%), and 0% are lower than 70 (goal <4%).  The calculated average blood sugar is 137.  The projected HbA1c for the next 3 months (GMI) is 6.6%. -Reviewing the CGM trends, sugars appear to be well-controlled, fluctuating within the target range, slightly increasing after breakfast with more fluctuating values during the day, but still within the goal range.  No need to change her regimen for now. - I suggested to:  Patient Instructions  Please continue: - Metformin  ER 500 mg in am and may take 500 mg at night  Please return in 6 months.  - we checked her HbA1c: 6.7% (lower) - advised to check sugars at different times of the day - 4x a day, rotating check times - advised for yearly eye exams >> she is UTD - will check an ACR today - return to clinic in 3-4 months  2. HL - Latest lipid panel was reviewed from 07/2023: Fractions at goal with the exception of an elevated LDL:  No results found for: CHOL, HDL, LDLCALC, LDLDIRECT, TRIG, CHOLHDL - She is not on cholesterol medications but she eats a mostly plant-based diet  Emilie Harden, MD PhD [facility] Endocr[facility]

## 2024-05-14 NOTE — Patient Instructions (Addendum)
 Please continue: - Metformin  ER 500 mg in am and may take 500 mg at night  Please return in 6 months.

## 2024-05-15 ENCOUNTER — Ambulatory Visit: Payer: Self-pay | Admitting: Internal Medicine

## 2024-05-15 LAB — MICROALBUMIN / CREATININE URINE RATIO
Creatinine, Urine: 198 mg/dL (ref 20–275)
Microalb Creat Ratio: 4 mg/g{creat} (ref <30)
Microalb, Ur: 0.8 mg/dL

## 2024-06-10 ENCOUNTER — Ambulatory Visit (INDEPENDENT_AMBULATORY_CARE_PROVIDER_SITE_OTHER): Admitting: Podiatry

## 2024-06-10 ENCOUNTER — Encounter: Payer: Self-pay | Admitting: Podiatry

## 2024-06-10 VITALS — Ht 63.0 in | Wt 149.2 lb

## 2024-06-10 DIAGNOSIS — M7751 Other enthesopathy of right foot: Secondary | ICD-10-CM | POA: Diagnosis not present

## 2024-06-10 DIAGNOSIS — M7752 Other enthesopathy of left foot: Secondary | ICD-10-CM

## 2024-06-10 MED ORDER — BETAMETHASONE SOD PHOS & ACET 6 (3-3) MG/ML IJ SUSP
3.0000 mg | Freq: Once | INTRAMUSCULAR | Status: AC
  Administered 2024-06-10: 3 mg via INTRA_ARTICULAR

## 2024-06-10 NOTE — Progress Notes (Signed)
   Chief Complaint  Patient presents with   Foot Pain    Pt is here due to bilateral great toe pain, she states the pain is the bunion area on both feet, this pain is ongoing comes and goes, states she does not want surgery, maybe a injection to help with the pain, daughters wedding is in August.    HPI: 73 y.o. female presenting today for evaluation of pain and tenderness associated to the bilateral great toe joint.  Gradual onset over the last several months however the patient has a history of arthritis.  Past Medical History:  Diagnosis Date   Allergy    Diabetes mellitus without complication (HCC) 2009?   diet controlled   Hx of pancreatitis 2019   Hypertension     Past Surgical History:  Procedure Laterality Date   ABDOMINAL HYSTERECTOMY  1996   Total   BREAST BIOPSY Left 1972, 1999   CESAREAN SECTION  1988   COLONOSCOPY  2018   HAND ARTHROPLASTY Bilateral 2014    Allergies  Allergen Reactions   Avelox [Moxifloxacin Hcl In Nacl] Nausea And Vomiting   Other Other (See Comments)    Pro Air Inhalers-produces Stridor  Floracarbons, hard to breathe  Pro Air Inhalers-produces Stridor Pro Air Inhalers-produces Stridor   Fluticasone Furoate-Vilanterol Palpitations   Macrobid [Nitrofurantoin Macrocrystal] Rash   Moxifloxacin Nausea And Vomiting   Pitavastatin  Other (See Comments) and Palpitations    Bradycardia    Sulfa Antibiotics Rash     Physical Exam: General: The patient is alert and oriented x3 in no acute distress.  Dermatology: Skin is warm, dry and supple bilateral lower extremities.   Vascular: Palpable pedal pulses bilaterally. Capillary refill within normal limits.  No appreciable edema.  No erythema.  Neurological: Grossly intact via light touch  Musculoskeletal Exam: Tenderness with palpation as well as limited range of motion of the first MTP bilateral.  Mild crepitus with range of motion as well consistent with arthritic changes to the  joint   Assessment/Plan of Care: 1.  Hallux limitus/DJD bilateral great toe joints  -Patient evaluated -Today we did discuss surgery versus conservative options regarding the patient's arthritic degenerative changes to the great toe joints.  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 Thresa EMERSON Sar, DPM    [address] [place], [place] [address]                Office [phone]  Fax [phone]

## 2024-06-29 ENCOUNTER — Encounter: Payer: Self-pay | Admitting: Internal Medicine

## 2024-07-11 ENCOUNTER — Encounter: Payer: Self-pay | Admitting: Internal Medicine

## 2024-07-11 ENCOUNTER — Other Ambulatory Visit: Payer: Self-pay

## 2024-07-11 DIAGNOSIS — E1165 Type 2 diabetes mellitus with hyperglycemia: Secondary | ICD-10-CM

## 2024-07-11 MED ORDER — DEXCOM G7 RECEIVER DEVI: 0 refills | Status: DC

## 2024-07-11 MED ORDER — DEXCOM G7 RECEIVER DEVI
0 refills | Status: AC
Start: 2024-07-11 — End: ?

## 2024-08-11 ENCOUNTER — Encounter: Payer: Self-pay | Admitting: Internal Medicine

## 2024-08-11 ENCOUNTER — Other Ambulatory Visit (HOSPITAL_COMMUNITY): Payer: Self-pay

## 2024-08-11 ENCOUNTER — Telehealth: Payer: Self-pay

## 2024-08-11 NOTE — Telephone Encounter (Signed)
 Pharmacy Patient Advocate Encounter   Received notification from CoverMyMeds that prior authorization for Dexcom G7 receiver is required/requested.   Insurance verification completed.   The patient is insured through [facility] .   Per test claim: Refill too soon. PA is not needed at this time. Medication was filled 09/28/2023. Next eligible fill date is 09/27/2024. Insurance only covers 1 receiver per year.

## 2024-08-11 NOTE — Telephone Encounter (Signed)
 Please Advise in Dr. Cheryln Absence: Do you have anything I could advise this patient with her morning blood sugars since she is not taking the Metformin ? Also I have looked at her Dexcom and it states it was last uploaded in June 2025

## 2024-09-16 ENCOUNTER — Encounter: Payer: Self-pay | Admitting: Internal Medicine

## 2024-09-29 LAB — HM DIABETES EYE EXAM

## 2024-10-17 ENCOUNTER — Encounter: Payer: Self-pay | Admitting: Internal Medicine

## 2024-10-17 NOTE — Telephone Encounter (Signed)
 Left message for patient schedule appointment.

## 2024-11-14 ENCOUNTER — Encounter: Payer: Self-pay | Admitting: Internal Medicine

## 2024-11-14 ENCOUNTER — Ambulatory Visit: Admitting: Internal Medicine

## 2024-11-14 ENCOUNTER — Other Ambulatory Visit: Payer: Self-pay | Admitting: Internal Medicine

## 2024-11-14 VITALS — BP 120/60 | HR 57 | Ht 63.0 in | Wt 147.8 lb

## 2024-11-14 DIAGNOSIS — E785 Hyperlipidemia, unspecified: Secondary | ICD-10-CM

## 2024-11-14 DIAGNOSIS — Z7984 Long term (current) use of oral hypoglycemic drugs: Secondary | ICD-10-CM

## 2024-11-14 DIAGNOSIS — E1165 Type 2 diabetes mellitus with hyperglycemia: Secondary | ICD-10-CM

## 2024-11-14 LAB — POCT GLYCOSYLATED HEMOGLOBIN (HGB A1C): Hemoglobin A1C: 7 % — AB (ref 4.0–5.6)

## 2024-11-14 NOTE — Progress Notes (Signed)
 Patient ID: Makenli Derstine, female   DOB: 1951-04-05, 73 y.o.   MRN: [id number]   HPI: Keonna Raether is a 73 y.o.-year-old female, initially referred by her gastroenterologist, Dr. Kristie, presenting for follow-up for DM2, dx ~2009, non insulin -dependent, fairly well controlled, without long-term complications except for pancreatitis.  Last visit 6 months ago.  Interim history: No increased urination, blurry vision, nausea, chest pain.  She started back on metformin  since last visit as sugars increased slightly.  They improved afterwards.  She tolerates metformin  well, without nausea, diarrhea.  Reviewed and history: In the past, she participated in [employer]), she adopted a whole food, no oil, plant-based diet and also started exercise (walking 4 to 7 miles daily with her husband).  Her sugars improved significantly and she was able to reduce Metformin  and even stop this completely afterwards.  However, she had to restart afterwards.  Reviewed HbA1c levels: 09/16/2024: HbA1c 7.5% Lab Results  Component Value Date   HGBA1C 6.7 (A) 05/14/2024   HGBA1C 6.9 (A) 01/03/2024   HGBA1C 8.1 (A) 08/01/2023  08/06/2023: HbA1c 8.5% 09/06/2022: HbA1c 7.6% 12/21/2021: HbA1c 7.3% 08/21/2019: HbA1c 6.8% 05/02/2019: HbA1c 7.4% 02/2019: HbA1c 7.4% 06/12/2018 : HbA1c 7.1% 12/21/2017: HbA1c 6.9% 06/08/2017: HbA1c 6.6%  She was previously on: - Metformin  ER 1000 >> 2000 mg at bedtime >> 1000 mg with dinner >> off >> now 500 mg 2x a day  She has a history of pancreatitis on 03/2018, possibly due to Januvia. She stopped Januvia at that time and also stopped Glucophage  DAW 2000 mg daily and  Ezetimibe and Lovaza then.   She was hospitalized 12/23/2023 at [facility] with ? recurrent pancreatitis (but possibly only constipation). She sees Dr. Kristie - on Linzess and Trulance.  She checks her blood sugars >4x a day with her CGM (with receiver):  Previously:  Previously:  Lowest sugar was 49 (Libre)  >> ... 90s >> 55, she has hypoglycemia awareness in the 29s.  Highest sugar was 256 >> upper 200s >> 238.  -No CKD, last BUN/creatinine:   Lab Results  Component Value Date   BUN 15 01/30/2024   BUN 9 01/30/2021   CREATININE 0.69 01/30/2024   CREATININE 0.80 10/03/2023   Lab Results  Component Value Date   MICRALBCREAT 4 05/14/2024  Previously on losartan  50, now off.  -+ HL; last set of lipids:  No results found for: CHOL, HDL, LDLCALC, LDLDIRECT, TRIG, CHOLHDL  Previously on Zetia and Lovaza, then on Livalo , now off.  Per history offered by patient: Ezetimibe was discontinued by Dr. Auston when cholesterol normalized with diet and Lovaza caused bradycardia into the high 30s.  - last eye exam was 09/2024: No DR; she has a history of cataracts [facility] - Dr. Dain note)  - No numbness and tingling in her feet. Has a h/o plantar fasciitis.  Last foot exam 05/14/2024 here in clinic.  Pt has FH of DM in mother, father, brother.  She was a engineer, civil (consulting) practitioner-retired.  ROS: + see HPI  I reviewed pt's medications, allergies, PMH, social hx, family hx, and changes were documented in the history of present illness. Otherwise, unchanged from my initial visit note.  Past Medical History:  Diagnosis Date   Allergy    Diabetes mellitus without complication (HCC) 2009?   diet controlled   Hx of pancreatitis 2019   Hypertension    Past Surgical History:  Procedure Laterality Date   ABDOMINAL HYSTERECTOMY  1996   Total   BREAST  BIOPSY Left 1972, 1999   CESAREAN SECTION  1988   COLONOSCOPY  2018   HAND ARTHROPLASTY Bilateral 2014   Social History   Socioeconomic History   Marital status: Married    Spouse name: Not on file   Number of children: 4   Years of education: Not on file   Highest education level: Not on file  Occupational History   NP  Tobacco Use   Smoking status: Never Smoker   Smokeless tobacco: Never Used  Substance and Sexual  Activity   Alcohol use: Yes    Comment: wine occasionally, 1 drink   Drug use: No   Current Outpatient Medications  Medication Sig Dispense Refill   aspirin EC 81 MG tablet Take 81 mg by mouth daily.     Continuous Glucose Receiver (DEXCOM G7 RECEIVER) DEVI Use with Dexcom G7 sensors to check blood glucose 1 each 0   Continuous Glucose Sensor (DEXCOM G7 SENSOR) MISC Use to check glucose continuously, change sensor every 10 days 9 each 4   Cyanocobalamin (B-12 PO) Take by mouth.     estradiol (CLIMARA) 0.1 mg/24hr patch Place 1 patch onto the skin once a week.     Estradiol (VAGIFEM) 10 MCG TABS vaginal tablet Place vaginally.     losartan -hydrochlorothiazide (HYZAAR) 50-12.5 MG tablet Hyzaar 50 mg-12.5 mg tablet     metFORMIN  (GLUCOPHAGE -XR) 500 MG 24 hr tablet Take 2 tablets (1,000 mg total) by mouth daily with supper. 180 tablet 3   Vitamin D, Ergocalciferol, (DRISDOL) 50000 units CAPS capsule      No current facility-administered medications for this visit.    Allergies  Allergen Reactions   Avelox [Moxifloxacin Hcl In Nacl] Nausea And Vomiting   Other Other (See Comments)    Pro Air Inhalers-produces Stridor  Floracarbons, hard to breathe  Pro Air Inhalers-produces Stridor Pro Air Inhalers-produces Stridor   Fluticasone Furoate-Vilanterol Palpitations   Macrobid [Nitrofurantoin Macrocrystal] Rash   Moxifloxacin Nausea And Vomiting   Pitavastatin  Other (See Comments) and Palpitations    Bradycardia    Sulfa Antibiotics Rash   Family History  Problem Relation Age of Onset   Diabetes Mother    Diabetes Father    Gastric cancer Brother    PE: BP 120/60   Pulse (!) 57   Ht 5' 3 (1.6 m)   Wt 147 lb 12.8 oz (67 kg)   SpO2 98%   BMI 26.18 kg/m  Wt Readings from Last 3 Encounters:  11/14/24 147 lb 12.8 oz (67 kg)  06/10/24 149 lb 3.2 oz (67.7 kg)  05/14/24 149 lb 3.2 oz (67.7 kg)   Constitutional: normal weight, in NAD Eyes:  EOMI, no exophthalmos ENT: no neck  masses, no cervical lymphadenopathy Cardiovascular: RRR, No MRG Respiratory: CTA B Musculoskeletal: no deformities Skin:no rashes Neurological: no tremor with outstretched hands.  ASSESSMENT: 1. DM2, off and on insulin  (currently off), without long-term complications, but with hyperglycemia - Recurrent pancreatitis     2. HL  3.  H/o Overweight -Remains on a mostly plant-based diet   PLAN:  1. Patient with longstanding, uncontrolled, type 2 diabetes, previously on sulfonylurea, metformin , DPP 4 inhibitor but off all diabetic medications after improving her diet before her visit in 2021 (whole food plant-based diet).  At that time, HbA1c improved to 5.9% and sugars were at goal without medications.  However, afterwards, HbA1c increased up to 8.5% in 07/2023.  At last visit, HbA1c decreased again to 6.7% while on metformin .  Sugars were mainly  fluctuating within the target range with only occasional higher blood sugars particularly after breakfast.  We did not change her regimen.  Since then, she came off metformin  but she sent me a message about higher blood sugars and we restarted this in 09/2024.  Indeed, an HbA1c obtained 09/16/2024 was higher, at 7.5%. - She was able to come off Tresiba  in the past due to good control.  We cannot use GLP-1 receptor agonist for her due to previous history of pancreatitis and Januvia and side effects to injectable medications in the class.  She had another episode of pancreatitis in the months prior to our last appointment.  A C-peptide was normal and  GAD antibodies were undetectable-checked at [facility] 07/2023. CGM interpretation: -At today's visit, we reviewed her CGM downloads: It appears that 95% of values are in target range (goal >70%), while 5% are higher than 180 (goal <25%), and 0% are lower than 70 (goal <4%).  The calculated average blood sugar is 131.  The projected HbA1c for the next 3 months (GMI) is 6.4%. -Reviewing the CGM trends, sugars appear  to be well-controlled, only increasing slightly after her smoothie in the morning.  However, the vast majority of her blood sugars are at goal and the predicted HbA1c is excellent, at 6.4%.  For now, I did not suggest any changes in her regimen.  We will continue metformin  500 mg twice a day.  She tolerates this well, without GI side effects. - I suggested to:  Patient Instructions  Please continue: - Metformin  ER 500 mg 2x a day  Please return in 5-6 months.  - we checked her HbA1c: 7.0% (higher than expected from her CGM) - advised to check sugars at different times of the day - 4x a day, rotating check times - advised for yearly eye exams >> she is UTD - return to clinic in 6 months  2. HL - Lipid panel reviewed from 09/2024: Fractions at goal with exception of an elevated LDL No results found for: CHOL, HDL, LDLCALC, LDLDIRECT, TRIG, CHOLHDL - She is not on a statin.  She had bradycardia with Lovaza.  She eats a mostly plant-based diet.  Lela Fendt, MD PhD [facility] Endocrinology

## 2024-11-14 NOTE — Patient Instructions (Addendum)
 Please continue: - Metformin  ER 500 mg 2x a day  Please return in ~5-6 months.

## 2024-11-21 ENCOUNTER — Other Ambulatory Visit: Payer: Self-pay | Admitting: Internal Medicine

## 2025-04-27 ENCOUNTER — Ambulatory Visit: Admitting: Internal Medicine
